# Patient Record
Sex: Male | Born: 1935 | Race: White | Hispanic: No | Marital: Married | Smoking: Former smoker
Health system: Southern US, Community
[De-identification: ages and names within clinical notes are randomized; demographics above are authoritative.]

## PROBLEM LIST (undated history)

## (undated) DIAGNOSIS — I729 Aneurysm of unspecified site: Secondary | ICD-10-CM

---

## 2008-11-27 ENCOUNTER — Ambulatory Visit: Payer: Self-pay | Admitting: Ophthalmology

## 2008-11-30 ENCOUNTER — Emergency Department: Payer: Self-pay | Admitting: Unknown Physician Specialty

## 2008-11-30 ENCOUNTER — Ambulatory Visit: Payer: Self-pay

## 2008-12-22 ENCOUNTER — Ambulatory Visit: Payer: Self-pay

## 2011-03-16 ENCOUNTER — Emergency Department: Payer: Self-pay | Admitting: Emergency Medicine

## 2012-04-04 ENCOUNTER — Emergency Department: Payer: Self-pay | Admitting: Emergency Medicine

## 2014-10-09 ENCOUNTER — Observation Stay: Payer: Self-pay | Admitting: Internal Medicine

## 2014-12-23 NOTE — Consult Note (Signed)
Chief Complaint:  Subjective/Chief Complaint no bm or bleeding since yesterday am.  tolerated enemas.  denies abdominal pain or nausea.   VITAL SIGNS/ANCILLARY NOTES: **Vital Signs.:   18-Feb-16 04:50  Vital Signs Type Routine  Temperature Temperature (F) 97.8  Celsius 36.5  Pulse Pulse 70  Respirations Respirations 18  Systolic BP Systolic BP 009  Diastolic BP (mmHg) Diastolic BP (mmHg) 72  Mean BP 102  Pulse Ox % Pulse Ox % 97  Pulse Ox Activity Level  At rest  Oxygen Delivery Room Air/ 21 %   Brief Assessment:  Cardiac Regular   Respiratory clear BS   Gastrointestinal details normal Soft  Nontender  Nondistended  No masses palpable  Bowel sounds normal   Lab Results: Routine Chem:  18-Feb-16 06:30   Glucose, Serum 79  BUN 18  Creatinine (comp) 1.02  Sodium, Serum 138  Potassium, Serum 3.6  Chloride, Serum 103  CO2, Serum 25  Calcium (Total), Serum 8.7  Anion Gap 10  Osmolality (calc) 276  eGFR (African American) >60  eGFR (Non-African American) >60 (eGFR values <97mL/min/1.73 m2 may be an indication of chronic kidney disease (CKD). Calculated eGFR, using the MRDR Study equation, is useful in  patients with stable renal function. The eGFR calculation will not be reliable in acutely ill patients when serum creatinine is changing rapidly. It is not useful in patients on dialysis. The eGFR calculation may not be applicable to patients at the low and high extremes of body sizes, pregnant women, and vegetarians.)  Routine Coag:  16-Feb-16 19:15   INR 1.1 (INR reference interval applies to patients on anticoagulant therapy. A single INR therapeutic range for coumarins is not optimal for all indications; however, the suggested range for most indications is 2.0 - 3.0. Exceptions to the INR Reference Range may include: Prosthetic heart valves, acute myocardial infarction, prevention of myocardial infarction, and combinations of aspirin and anticoagulant. The  need for a higher or lower target INR must be assessed individually. Reference: The Pharmacology and Management of the Vitamin K  antagonists: the seventh ACCP Conference on Antithrombotic and Thrombolytic Therapy. FGHWE.9937 Sept:126 (3suppl): [id number]. A HCT value >55% may artifactually increase the PT.  In one study,  the increase was an average of 25%. Reference:  "Effect on Routine and Special Coagulation Testing Values of Citrate Anticoagulant Adjustment in Patients with High HCT Values." American Journal of Clinical Pathology 2006;126:400-405.)  Routine Hem:  17-Feb-16 07:38   Hemoglobin (CBC) 13.0  18-Feb-16 06:30   WBC (CBC) 7.3  RBC (CBC)  3.96  Hemoglobin (CBC)  12.8  Hematocrit (CBC)  39.0  Platelet Count (CBC)  64  MCV 99  MCH 32.5  MCHC 33.0  RDW 13.5  Neutrophil % 69.9  Lymphocyte % 18.1  Monocyte % 8.8  Eosinophil % 2.2  Basophil % 1.0  Neutrophil # 5.1  Lymphocyte # 1.3  Eosinophil # 0.2  Basophil # 0.1 (Result(s) reported on 11 Oct 2014 at 07:36AM.)   Assessment/Plan:  Assessment/Plan:  Assessment 1) hematochezia-not recurrent since hospitalization, no abd pain, no rectal pain , no n/v.  2) h/o "inguinal aneurysm" per patietn.  I could not find charts in this regard.  Per patietn description today, he had a exploratory surgery many years ago in [place], was transferred urgently to [facility] where two further surgeries were done,  these sounding much like a possible aortic aneurysm repair.   Plan 1) flexible sigmoidoscopy today, I have discussed the risks benedits and complications of proceedure to include  not limited to  bleeding infection and perforation and he wishes to proceed.   Further recs to follow.   Electronic Signatures: Loistine Simas (MD)  (Signed 18-Feb-16 15:28)  Authored: Chief Complaint, VITAL SIGNS/ANCILLARY NOTES, Brief Assessment, Lab Results, Assessment/Plan   Last Updated: 18-Feb-16 15:28 by Loistine Simas (MD)

## 2014-12-23 NOTE — Consult Note (Signed)
PATIENT NAME:  Brent Carpenter, Brent Carpenter MR#:  [id number] DATE OF BIRTH:  [birth year]  DATE OF CONSULTATION:  10/10/2014  REFERRING PHYSICIAN:   CONSULTING PHYSICIAN:  Keturah Barrehristiane H. Trica Usery, NP  REASON FOR CONSULTATION: GI consult ordered by Dr. Clint GuyHower for evaluation of bright red blood per rectum.   HISTORY OF PRESENT ILLNESS: I appreciate consult for an 81 year old Caucasian man with history of benign essential tremors for evaluation of brpr. No history of colonoscopy. Reports passing a small amount of bright red blood with stool 2 days ago, worried this would happen again so the night before last, put extra towels on his bed before going to sleep to catch any blood. Looked 7 hours later with approximately a cupful of bright red blood on the towels. States he had a small amount of blood yesterday with even less today. Denies problems with constipation, straining, heavy lifting, abdominal and rectal pain, melena, and other episodes of rectal bleeding. States he currently feels great. Denies family history of colorectal cancer, colon polyps. His hemoglobin has been checked 3 times, last count 13, platelets are low, red cells normocytic. PT-INR normal. LFTs unremarkable. GFR 50.   REVIEW OF SYSTEMS: Ten systems reviewed. Significant for hearing loss bilaterally and benign tremors.   PAST MEDICAL HISTORY: Appendectomy, questionable inguinal aneurysm repair, cataract surgery, glaucoma, possible hypertension. Does not have PCP or follow with one in many years.   FAMILY HISTORY: Mother with CVA. Father with CHF. Sister with brain tumor. No known colorectal cancer, colon polyps, liver disease, or ulcers.   ALLERGIES: PENICILLIN.   HOME MEDICATIONS: Propranolol twice a day, unknown dose, acetylsalicylic acid 81 mg p.o. twice a day.   LABORATORY DATA: Most recent labs: Glucose 102. BUN 31, creatinine 1.41, sodium 143, potassium 4.1, GFR 50, calcium 9, total protein 7.3, albumin 3.7, total bilirubin 1.2, ALP 69, AST  30, ALT 32. WBC 7.4, hemoglobin has ranged 14.7 to 13.4 to 13, hematocrit 43 to 40.7 to 39, platelets 51. Red cells normocytic. PT 14, INR 1.1.   PHYSICAL EXAMINATION: VITAL SIGNS: Most recent: Temperature 97.1, pulse 60, respiratory rate 20, blood pressure 143/62, oxygen saturation 98% on room air.  GENERAL: Well-appearing elderly gentleman in no acute distress.  HEENT: Normocephalic, atraumatic. Conjunctivae: Pink. Sclerae: Clear. Mouth: No lesions. Mucous membranes: Pink and moist.  NECK: Supple. No thyromegaly or JVD.  CHEST: Respirations eupneic.  LUNGS: Clear bilaterally.  CARDIAC: S1, S2, RRR. No MRG. No appreciable edema.  ABDOMEN: Protuberant abdomen. Bowel sounds x 4. Soft, nontender, nondistended. No guarding, rigidity, peritoneal signs, or other abnormalities. No masses or hepatosplenomegaly.  SKIN: Warm, dry, pink. No erythema, lesion, or rash.  RECTAL: Small external hemorrhoid. Internal exam with small mobile irregularity, nontender, large prostate. There is some pink, mucousy effluent on my finger.  MUSCULOSKELETAL: MAEW x 4. Strength is 5/5.  NEUROLOGICAL: Cranial nerves II through XII intact. Alert, oriented x 3.  PSYCHIATRIC: Pleasant, calm, somewhat tangential, cooperative.   IMPRESSION AND PLAN: Bright red blood per rectum, suspect anal outlet bleeding. Would recommend Anusol suppositories b.i.d. for 10 days for now. I did discuss possibility of flexible sigmoidoscopy with Dr. Marva PandaSkulskie, as the patient has never had colonoscopy. The patient has eaten a clear liquid lunch today, so what we will do is we will reassess blood count tomorrow to check his thrombocytopenia. Plan for flexible sigmoidoscopy in the afternoon, as clinically feasible. We will also assess A PSA, considering CT abdomen and pelvis. Thank you very much for this consult.   These  services were provided by Vevelyn Pat, MSN, NPC, under collaboration with Christena Deem, MD, with whom I discussed this  patient in full    ____________________________ Keturah Barre, NP chl:mw D: 10/10/2014 15:11:41 ET T: 10/10/2014 15:34:33 ET JOB#: [id number]  cc: Keturah Barre, NP, <Dictator> Eustaquio Maize Zaiden Ludlum FNP ELECTRONICALLY SIGNED 10/11/2014 17:05

## 2014-12-23 NOTE — Consult Note (Signed)
Chief Complaint:  Subjective/Chief Complaint seen for hematochezia.  Please see full Gi consult and brief consult note.   Patietn admitted with small amounts of bright red rectal bleeding.  over the past several days.  Hemodynamically stable, hgb stable.  Likely anal outlet bleeding/hemorrhoids.  Continue current, will arrange for flex sig tomorrow pm, likely without sedation, depending on platelet count.   I have discussed the risks beneftis and complications of proceedure to include not limited to bleeding infection perforations and sedation and he wishes to proceed.   Further recs to follow.   VITAL SIGNS/ANCILLARY NOTES: **Vital Signs.:   17-Feb-16 04:25  Vital Signs Type Routine  Temperature Temperature (F) 97.1  Celsius 36.1  Temperature Source oral  Respirations Respirations 20  Systolic BP Systolic BP 291  Diastolic BP (mmHg) Diastolic BP (mmHg) 62  Mean BP 89  Pulse Ox % Pulse Ox % 98  Pulse Ox Activity Level  At rest  Oxygen Delivery Room Air/ 21 %   Brief Assessment:  Cardiac Regular   Respiratory clear BS   Gastrointestinal details normal Soft  Nontender  Nondistended  No masses palpable  Bowel sounds normal   Lab Results: Routine Chem:  17-Feb-16 01:09   Glucose, Serum  102  BUN  31  Creatinine (comp)  1.41  Sodium, Serum 143  Potassium, Serum 4.1  Chloride, Serum  110  CO2, Serum 24  Calcium (Total), Serum 9.0  Anion Gap 9  Osmolality (calc) 292  eGFR (African American) >60  eGFR (Non-African American)  50 (eGFR values <47m/min/1.73 m2 may be an indication of chronic kidney disease (CKD). Calculated eGFR, using the MRDR Study equation, is useful in  patients with stable renal function. The eGFR calculation will not be reliable in acutely ill patients when serum creatinine is changing rapidly. It is not useful in patients on dialysis. The eGFR calculation may not be applicable to patients at the low and high extremes of body sizes, pregnant women, and  vegetarians.)  Routine Coag:  16-Feb-16 19:15   INR 1.1 (INR reference interval applies to patients on anticoagulant therapy. A single INR therapeutic range for coumarins is not optimal for all indications; however, the suggested range for most indications is 2.0 - 3.0. Exceptions to the INR Reference Range may include: Prosthetic heart valves, acute myocardial infarction, prevention of myocardial infarction, and combinations of aspirin and anticoagulant. The need for a higher or lower target INR must be assessed individually. Reference: The Pharmacology and Management of the Vitamin K  antagonists: the seventh ACCP Conference on Antithrombotic and Thrombolytic Therapy. CBTYOM.6004Sept:126 (3suppl): [id number] A HCT value >55% may artifactually increase the PT.  In one study,  the increase was an average of 25%. Reference:  "Effect on Routine and Special Coagulation Testing Values of Citrate Anticoagulant Adjustment in Patients with High HCT Values." American Journal of Clinical Pathology 2006;126:400-405.)  Routine Hem:  16-Feb-16 19:15   Hemoglobin (CBC) 14.7  Platelet Count (CBC)  67 (Result(s) reported on 09 Oct 2014 at 07:48PM.)  17-Feb-16 01:09   WBC (CBC) 8.8  RBC (CBC)  4.10  Hemoglobin (CBC) 13.4  Hematocrit (CBC) 40.7  Platelet Count (CBC)  62  MCV 99  MCH 32.8  MCHC 33.0  RDW 14.0  Neutrophil % 70.6  Lymphocyte % 19.1  Monocyte % 8.9  Eosinophil % 0.8  Basophil % 0.6  Neutrophil # 6.2  Lymphocyte # 1.7  Monocyte # 0.8  Eosinophil # 0.1  Basophil # 0.1 (Result(s) reported on 10 Oct 2014 at 01:56AM.)    07:38   WBC (CBC) 7.4  RBC (CBC)  3.92  Hemoglobin (CBC) 13.0  Hematocrit (CBC)  39.0  Platelet Count (CBC)  51  MCV 100  MCH 33.2  MCHC 33.3  RDW 13.7  Neutrophil % 65.9  Lymphocyte % 21.3  Monocyte % 9.8  Eosinophil % 1.8  Basophil % 1.2  Neutrophil # 4.9  Lymphocyte # 1.6  Monocyte # 0.7  Eosinophil # 0.1  Basophil # 0.1 (Result(s) reported on  10 Oct 2014 at 08:04AM.)   Electronic Signatures: Loistine Simas (MD)  (Signed 17-Feb-16 15:03)  Authored: Chief Complaint, VITAL SIGNS/ANCILLARY NOTES, Brief Assessment, Lab Results   Last Updated: 17-Feb-16 15:03 by Loistine Simas (MD)

## 2014-12-23 NOTE — Discharge Summary (Signed)
PATIENT NAME:  Brent Carpenter, Brent Carpenter MR#:  [id number] DATE OF BIRTH:  Dec 11, 1923  DATE OF ADMISSION:  10/09/2014 DATE OF DISCHARGE:  10/11/2014  DISCHARGE DIAGNOSES: Internal hemorrhoid, acute renal failure, hypertension, fall and weakness.   PROCEDURES: Sigmoidoscopy showed internal hemorrhoid.   CONDITION: Stable.   CODE STATUS: Full code.   HOME MEDICATIONS: Please refer to the medication reconciliation list.   SERVICES: The patient needs home health, physical therapy, and nurse aide.   DIET: Low-sodium diet.   ACTIVITY: As tolerated.   FOLLOWUP CARE: Follow with PCP and Dr. Marva PandaSkulskie within 1-2 weeks.   REASON FOR ADMISSION: Bright red blood per rectum.    HOSPITAL COURSE: The patient is an 81 year old Caucasian gentleman with a history of hypertension came to the hospital due to bright red blood per rectum. For detailed history and physical examination, please refer to the admission note dictated by Dr. Clint GuyHower. After admission, the patient was treated with Protonix. Dr. Marva PandaSkulskie suggested a sigmoidoscopy, which showed internal hemorrhoids. In addition, the patient has acute renal failure, which is improved after treatment with IV fluids. According to physical therapy evaluation, the patient needs skilled nursing facility placement; however, the patient refused to go to rehabilitation facility. The patient was discharged to home with home health and physical therapy.   I discussed the patient'Carpenter discharge plan with nurse, case manager, and Dr. Marva PandaSkulskie.   TIME SPENT: About 35 minutes.   ____________________________ Shaune PollackQing Lemarcus Baggerly, MD qc:bm D: 10/16/2014 15:50:47 ET T: 10/17/2014 01:57:46 ET JOB#: [id number]  cc: Shaune PollackQing Rodgerick Gilliand, MD, <Dictator> Shaune PollackQING Hanifa Antonetti MD ELECTRONICALLY SIGNED 10/18/2014 12:48

## 2014-12-23 NOTE — H&P (Signed)
PATIENT NAME:  Brent Carpenter, Brent Carpenter MR#:  [id number] DATE OF BIRTH:  03/16/1924  DATE OF ADMISSION:  10/09/2014  REFERRING PHYSICIAN: Bobetta Lime A. Inocencio Homes, MD  PRIMARY CARE PHYSICIAN: None.   CHIEF COMPLAINT: Bright red blood per rectum.  HISTORY OF PRESENT ILLNESS:  An 81 year old Caucasian gentleman with history of essential hypertension, presented with bright red blood per rectum. He is a fairly poor historian. History obtained from the patient as well as wife, who is present at bedside and describes 1 episode of bright red blood per rectum, which occurred originally about 2 days ago, states,  "about a handful of blood", not mixed with stool. Had a repeat episode today of bright red blood.  Once again, no stool, thus, was presented to the hospital for further workup and evaluation. Denies any chest pain, palpitations, lightheadedness, abdominal pain, fever, chills, further symptomatology.   REVIEW OF SYSTEMS:  CONSTITUTIONAL: Denies fevers, chills, fatigue, weakness.  EYES: Denies blurred vision, double vision, eye pain.  EARS, NOSE, THROAT: Denies tinnitus, ear pain, hearing loss. RESPIRATORY: Denies cough, wheeze, or shortness of breath. CARDIOVASCULAR: Denies chest pain, palpitations, edema.  GASTROINTESTINAL: Denies nausea, vomiting, diarrhea, abdominal pain. Positive for bright blood per rectum.  GENITOURINARY: Denies dysuria or hematuria.  ENDOCRINE: Denies nocturia, thyroid problems.  HEMATOLOGIC AND LYMPHATIC: No easy bruising or bleeding.  SKIN: No mass, rashes or lesions.  MUSCULOSKELETAL: Denies pain in neck, back, shoulder, knees, hips or arthritic symptoms.  NEUROLOGIC: Denies paralysis, paresthesias.  PSYCHIATRIC: Denies anxiety or depressive symptoms.  Otherwise full review of systems performed by me is negative.   PAST MEDICAL HISTORY:  Includes essential hypertension; however, does not follow with PCP.  No further diagnoses.   SOCIAL HISTORY: Denies alcohol, tobacco, or drug  usage.   FAMILY HISTORY: Denies any known cardiovascular or pulmonary disorders.   ALLERGIES: PENICILLIN.   HOME MEDICATIONS: Include hydrochlorothiazide 25 mg p.o. daily, acetaminophen/oxycodone 325/5 mg p.o. every 4 hours as needed for pain, aspirin 81 mg p.o. daily.   PHYSICAL EXAMINATION:  VITAL SIGNS: Temperature 97.7, heart rate 62, respirations 20, blood pressure 158/76, saturating 96% on room air. Weight 90.7 kg, BMI 27.9.  GENERAL: Well-nourished, well-developed, Caucasian gentleman, currently in no acute distress.  HEAD: Normocephalic, atraumatic.  EYES: Pupils equal, round, and also intact. No scleral icterus.  MOUTH: Moist mucosal membrane. Dentition intact. No abscess noted.  EARS, NOSE AND THROAT: Clear without exudates. No external lesions.  NECK: Supple. No thyromegaly. No nodules. No JVD.  PULMONARY: Clear to auscultation bilaterally without wheezes, rubs, or rhonchi. No use of accessory muscles. Good respiratory effort.  CHEST: Nontender to palpation.  CARDIOVASCULAR: S1, S2, regular rate and rhythm. No murmurs, rubs, or gallops. No edema. Pedal pulses 2+ bilaterally.  GASTROINTESTINAL: Soft, nontender, nondistended. No masses. Positive bowel sounds. No hepatosplenomegaly.  MUSCULOSKELETAL: No swelling, clubbing, or edema. Range of motion full in all extremities.  NEUROLOGIC: Cranial nerves II through XII intact. No gross focal neurological deficits. Sensation intact, reflexes intact.  SKIN: No ulceration, lesions, rashes, or cyanosis. Skin warm and dry. Turgor intact.  LABORATORY DATA: Sodium of 141, potassium 4.5, chloride 108, bicarbonate 23, BUN 30, creatinine 1.59, glucose 115. LFTs: Bilirubin 1.2, otherwise, within normal limits. WBC 9.8, hemoglobin 14.7, platelets of 67,000.   ASSESSMENT AND PLAN: An 81 year old Caucasian male with a history of essential hypertension, presented with blood per rectum.   1. Bright red blood per rectum. CBCs  every 6 hours. We will  consult gastroenterology. Hold aspirin and further anticoagulants  and antiplatelet medications and transfuse hemoglobin of less than 7.  2. Acute kidney injury: Likely prerenal in etiology. Provide IV fluid hydration. Follow urine output and renal function. Hold hydrochlorothiazide.  3. Hypertension, essential. Hold hydrochlorothiazide as stated above.  4. Venous thromboembolism prophylaxis with sequential compression devices.  CODE STATUS: Full code.   TIME SPENT: 45 minutes.   ____________________________ Cletis Athensavid K. Candon Caras, MD dkh:ap D: 10/09/2014 23:07:49 ET T: 10/09/2014 23:20:38 ET JOB#: [id number]  cc: Cletis Athensavid K. Trynity Skousen, MD, <Dictator> Reise Gladney Synetta ShadowK Jorryn Casagrande MD ELECTRONICALLY SIGNED 10/10/2014 20:40

## 2014-12-23 NOTE — Consult Note (Signed)
Brief Consult Note: Diagnosis: BRPR.   Patient was seen by consultant.   Consult note dictated.   Comments: APpreciate consult for 81y/o caucasian man with hx benign essential tremors for evaluation of brpr. No history of colonoscopy. Reports passing a small amount of bright red blood with stool 2d ago. Worried that this would happen again, so night before last put extra towels on his bed before going to sleep to catch any blood. Woke 7h later with about a cup full of brpr on them. States he had a small amt blood with stool yesterday and even less today with bowel movement. Denies problems with constipation, straining, heavy lifting, abdominal and rectal pain, melena/other episodes of rectal bleeding. States he currently feels great.  Denies family history of colorectal cancer & colon polyps. His hemoglobin has been checked 3 times, last count 13. Platelets low. Red cells normocytic. PT/INR normal. LFts unremarkable. GFR 50. rectal exam nontender. Has small external hemorrhoid and small irregular soft internal abnormality: small amount of pink effluent was on finger. Abdominal exam benign. Hemodynamically stable. Impression/plan: brpr. Suspect anal outlet bleeding: would recommend anusol supp bid x 10d for now. Will discuss possibility of flexible sigmoidoscopy with Dr Marva PandaSkulskie, as patient has never had cspy: patient has eaten a clear liquid lunch today.  Electronic Signatures: Vevelyn PatLondon, Tomasz Steeves H (NP)  (Signed 17-Feb-16 13:33)  Authored: Brief Consult Note   Last Updated: 17-Feb-16 13:33 by Keturah BarreLondon, Nazaire Cordial H (NP)

## 2018-09-12 ENCOUNTER — Other Ambulatory Visit: Payer: Self-pay | Admitting: Nephrology

## 2018-09-12 DIAGNOSIS — N183 Chronic kidney disease, stage 3 unspecified: Secondary | ICD-10-CM

## 2018-10-10 ENCOUNTER — Ambulatory Visit
Admission: RE | Admit: 2018-10-10 | Discharge: 2018-10-10 | Disposition: A | Discharge status: Home / Self Care | Payer: Medicare Other | Source: Ambulatory Visit | Attending: Nephrology | Admitting: Nephrology

## 2018-10-10 DIAGNOSIS — N183 Chronic kidney disease, stage 3 unspecified: Secondary | ICD-10-CM

## 2019-05-17 ENCOUNTER — Inpatient Hospital Stay
Admission: EM | Admit: 2019-05-17 | Discharge: 2019-05-22 | DRG: 683 | Disposition: A | Discharge status: Skilled Nursing Facility | Payer: Medicare Other | Attending: Internal Medicine | Admitting: Internal Medicine

## 2019-05-17 ENCOUNTER — Encounter: Payer: Self-pay | Admitting: Emergency Medicine

## 2019-05-17 ENCOUNTER — Emergency Department: Payer: Medicare Other

## 2019-05-17 ENCOUNTER — Other Ambulatory Visit: Payer: Self-pay

## 2019-05-17 DIAGNOSIS — N179 Acute kidney failure, unspecified: Principal | ICD-10-CM

## 2019-05-17 DIAGNOSIS — Y92009 Unspecified place in unspecified non-institutional (private) residence as the place of occurrence of the external cause: Secondary | ICD-10-CM

## 2019-05-17 DIAGNOSIS — R7989 Other specified abnormal findings of blood chemistry: Secondary | ICD-10-CM

## 2019-05-17 DIAGNOSIS — Z20828 Contact with and (suspected) exposure to other viral communicable diseases: Secondary | ICD-10-CM | POA: Diagnosis present

## 2019-05-17 DIAGNOSIS — N138 Other obstructive and reflux uropathy: Secondary | ICD-10-CM | POA: Diagnosis present

## 2019-05-17 DIAGNOSIS — E860 Dehydration: Secondary | ICD-10-CM | POA: Diagnosis present

## 2019-05-17 DIAGNOSIS — R54 Age-related physical debility: Secondary | ICD-10-CM | POA: Diagnosis present

## 2019-05-17 DIAGNOSIS — S8012XA Contusion of left lower leg, initial encounter: Secondary | ICD-10-CM | POA: Diagnosis present

## 2019-05-17 DIAGNOSIS — R778 Other specified abnormalities of plasma proteins: Secondary | ICD-10-CM

## 2019-05-17 DIAGNOSIS — R451 Restlessness and agitation: Secondary | ICD-10-CM | POA: Diagnosis not present

## 2019-05-17 DIAGNOSIS — R310 Gross hematuria: Secondary | ICD-10-CM | POA: Diagnosis not present

## 2019-05-17 DIAGNOSIS — J90 Pleural effusion, not elsewhere classified: Secondary | ICD-10-CM

## 2019-05-17 DIAGNOSIS — M6282 Rhabdomyolysis: Secondary | ICD-10-CM | POA: Diagnosis present

## 2019-05-17 DIAGNOSIS — S40022A Contusion of left upper arm, initial encounter: Secondary | ICD-10-CM | POA: Diagnosis present

## 2019-05-17 DIAGNOSIS — Z7982 Long term (current) use of aspirin: Secondary | ICD-10-CM

## 2019-05-17 DIAGNOSIS — E870 Hyperosmolality and hypernatremia: Secondary | ICD-10-CM | POA: Diagnosis present

## 2019-05-17 DIAGNOSIS — Z9181 History of falling: Secondary | ICD-10-CM

## 2019-05-17 DIAGNOSIS — R251 Tremor, unspecified: Secondary | ICD-10-CM | POA: Diagnosis present

## 2019-05-17 DIAGNOSIS — D696 Thrombocytopenia, unspecified: Secondary | ICD-10-CM | POA: Diagnosis present

## 2019-05-17 DIAGNOSIS — D631 Anemia in chronic kidney disease: Secondary | ICD-10-CM | POA: Diagnosis present

## 2019-05-17 DIAGNOSIS — N132 Hydronephrosis with renal and ureteral calculous obstruction: Secondary | ICD-10-CM | POA: Diagnosis present

## 2019-05-17 DIAGNOSIS — H409 Unspecified glaucoma: Secondary | ICD-10-CM | POA: Diagnosis present

## 2019-05-17 DIAGNOSIS — N183 Chronic kidney disease, stage 3 (moderate): Secondary | ICD-10-CM | POA: Diagnosis present

## 2019-05-17 DIAGNOSIS — E872 Acidosis: Secondary | ICD-10-CM | POA: Diagnosis present

## 2019-05-17 DIAGNOSIS — H9190 Unspecified hearing loss, unspecified ear: Secondary | ICD-10-CM | POA: Diagnosis present

## 2019-05-17 DIAGNOSIS — S40021A Contusion of right upper arm, initial encounter: Secondary | ICD-10-CM | POA: Diagnosis present

## 2019-05-17 DIAGNOSIS — R748 Abnormal levels of other serum enzymes: Secondary | ICD-10-CM

## 2019-05-17 DIAGNOSIS — N201 Calculus of ureter: Secondary | ICD-10-CM

## 2019-05-17 DIAGNOSIS — I129 Hypertensive chronic kidney disease with stage 1 through stage 4 chronic kidney disease, or unspecified chronic kidney disease: Secondary | ICD-10-CM | POA: Diagnosis present

## 2019-05-17 DIAGNOSIS — D72829 Elevated white blood cell count, unspecified: Secondary | ICD-10-CM | POA: Diagnosis present

## 2019-05-17 DIAGNOSIS — Z87891 Personal history of nicotine dependence: Secondary | ICD-10-CM

## 2019-05-17 DIAGNOSIS — Z823 Family history of stroke: Secondary | ICD-10-CM

## 2019-05-17 DIAGNOSIS — Z8679 Personal history of other diseases of the circulatory system: Secondary | ICD-10-CM

## 2019-05-17 DIAGNOSIS — N401 Enlarged prostate with lower urinary tract symptoms: Secondary | ICD-10-CM | POA: Diagnosis present

## 2019-05-17 DIAGNOSIS — W19XXXA Unspecified fall, initial encounter: Secondary | ICD-10-CM

## 2019-05-17 DIAGNOSIS — W010XXA Fall on same level from slipping, tripping and stumbling without subsequent striking against object, initial encounter: Secondary | ICD-10-CM | POA: Diagnosis present

## 2019-05-17 DIAGNOSIS — Z79899 Other long term (current) drug therapy: Secondary | ICD-10-CM

## 2019-05-17 DIAGNOSIS — S8011XA Contusion of right lower leg, initial encounter: Secondary | ICD-10-CM | POA: Diagnosis present

## 2019-05-17 DIAGNOSIS — R740 Nonspecific elevation of levels of transaminase and lactic acid dehydrogenase [LDH]: Secondary | ICD-10-CM | POA: Diagnosis present

## 2019-05-17 HISTORY — DX: Aneurysm of unspecified site: I72.9

## 2019-05-17 LAB — URINALYSIS, COMPLETE (UACMP) WITH MICROSCOPIC
Bacteria, UA: NONE SEEN
Bilirubin Urine: NEGATIVE
Glucose, UA: NEGATIVE mg/dL
Ketones, ur: NEGATIVE mg/dL
Leukocytes,Ua: NEGATIVE
Nitrite: NEGATIVE
Protein, ur: NEGATIVE mg/dL
Specific Gravity, Urine: 1.011 (ref 1.005–1.030)
pH: 5 (ref 5.0–8.0)

## 2019-05-17 LAB — COMPREHENSIVE METABOLIC PANEL
ALT: 28 U/L (ref 0–44)
AST: 83 U/L — ABNORMAL HIGH (ref 15–41)
Albumin: 3.6 g/dL (ref 3.5–5.0)
Alkaline Phosphatase: 103 U/L (ref 38–126)
Anion gap: 17 — ABNORMAL HIGH (ref 5–15)
BUN: 83 mg/dL — ABNORMAL HIGH (ref 8–23)
CO2: 18 mmol/L — ABNORMAL LOW (ref 22–32)
Calcium: 9.5 mg/dL (ref 8.9–10.3)
Chloride: 112 mmol/L — ABNORMAL HIGH (ref 98–111)
Creatinine, Ser: 3.48 mg/dL — ABNORMAL HIGH (ref 0.61–1.24)
GFR calc Af Amer: 17 mL/min — ABNORMAL LOW (ref >60)
GFR calc non Af Amer: 14 mL/min — ABNORMAL LOW (ref >60)
Glucose, Bld: 75 mg/dL (ref 70–99)
Potassium: 4.7 mmol/L (ref 3.5–5.1)
Sodium: 147 mmol/L — ABNORMAL HIGH (ref 135–145)
Total Bilirubin: 2.9 mg/dL — ABNORMAL HIGH (ref 0.3–1.2)
Total Protein: 7 g/dL (ref 6.5–8.1)

## 2019-05-17 LAB — CBC WITH DIFFERENTIAL/PLATELET
Abs Immature Granulocytes: 0.1 10*3/uL — ABNORMAL HIGH (ref 0.00–0.07)
Basophils Absolute: 0 10*3/uL (ref 0.0–0.1)
Basophils Relative: 0 %
Eosinophils Absolute: 0 10*3/uL (ref 0.0–0.5)
Eosinophils Relative: 0 %
HCT: 30.5 % — ABNORMAL LOW (ref 39.0–52.0)
Hemoglobin: 9.7 g/dL — ABNORMAL LOW (ref 13.0–17.0)
Immature Granulocytes: 1 %
Lymphocytes Relative: 5 %
Lymphs Abs: 0.8 10*3/uL (ref 0.7–4.0)
MCH: 28.9 pg (ref 26.0–34.0)
MCHC: 31.8 g/dL (ref 30.0–36.0)
MCV: 90.8 fL (ref 80.0–100.0)
Monocytes Absolute: 1.4 10*3/uL — ABNORMAL HIGH (ref 0.1–1.0)
Monocytes Relative: 9 %
Neutro Abs: 13.4 10*3/uL — ABNORMAL HIGH (ref 1.7–7.7)
Neutrophils Relative %: 85 %
Platelets: 98 10*3/uL — ABNORMAL LOW (ref 150–400)
RBC: 3.36 MIL/uL — ABNORMAL LOW (ref 4.22–5.81)
RDW: 15.9 % — ABNORMAL HIGH (ref 11.5–15.5)
WBC: 15.7 10*3/uL — ABNORMAL HIGH (ref 4.0–10.5)
nRBC: 0.2 % (ref 0.0–0.2)

## 2019-05-17 LAB — TROPONIN I (HIGH SENSITIVITY)
Troponin I (High Sensitivity): 131 ng/L (ref <18)
Troponin I (High Sensitivity): 111 ng/L (ref <18)

## 2019-05-17 LAB — SARS CORONAVIRUS 2 BY RT PCR (HOSPITAL ORDER, PERFORMED IN ~~LOC~~ HOSPITAL LAB): SARS Coronavirus 2: NEGATIVE

## 2019-05-17 LAB — CK: Total CK: 2305 U/L — ABNORMAL HIGH (ref 49–397)

## 2019-05-17 MED ORDER — SODIUM CHLORIDE 0.9 % IV SOLN
INTRAVENOUS | Status: DC
  Administered 2019-05-17 – 2019-05-21 (×6): via INTRAVENOUS

## 2019-05-17 MED ORDER — TRAMADOL HCL 50 MG PO TABS
50.0000 mg | ORAL_TABLET | Freq: Two times a day (BID) | ORAL | Status: DC | PRN
  Administered 2019-05-20: 50 mg via ORAL
  Filled 2019-05-17: qty 1

## 2019-05-17 MED ORDER — ASPIRIN 81 MG PO CHEW
324.0000 mg | CHEWABLE_TABLET | Freq: Once | ORAL | Status: AC
  Administered 2019-05-17: 324 mg via ORAL
  Filled 2019-05-17: qty 4

## 2019-05-17 MED ORDER — BRIMONIDINE TARTRATE 0.15 % OP SOLN
1.0000 [drp] | Freq: Two times a day (BID) | OPHTHALMIC | Status: DC
  Administered 2019-05-17 – 2019-05-22 (×8): 1 [drp] via OPHTHALMIC
  Filled 2019-05-17 (×2): qty 5

## 2019-05-17 MED ORDER — ONDANSETRON HCL 4 MG PO TABS: 4.0000 mg | ORAL_TABLET | Freq: Four times a day (QID) | ORAL | Status: DC | PRN

## 2019-05-17 MED ORDER — LABETALOL HCL 5 MG/ML IV SOLN: 10.0000 mg | INTRAVENOUS | Status: DC | PRN

## 2019-05-17 MED ORDER — SODIUM CHLORIDE 0.9 % IV BOLUS
1000.0000 mL | Freq: Once | INTRAVENOUS | Status: AC
  Administered 2019-05-17: 1000 mL via INTRAVENOUS

## 2019-05-17 MED ORDER — POLYETHYLENE GLYCOL 3350 17 G PO PACK: 17.0000 g | PACK | Freq: Every day | ORAL | Status: DC | PRN

## 2019-05-17 MED ORDER — ASPIRIN EC 81 MG PO TBEC
81.0000 mg | DELAYED_RELEASE_TABLET | Freq: Every day | ORAL | Status: DC
  Administered 2019-05-18 – 2019-05-20 (×3): 81 mg via ORAL
  Filled 2019-05-17 (×3): qty 1

## 2019-05-17 MED ORDER — ONDANSETRON HCL 4 MG/2ML IJ SOLN: 4.0000 mg | Freq: Four times a day (QID) | INTRAMUSCULAR | Status: DC | PRN

## 2019-05-17 MED ORDER — HEPARIN SODIUM (PORCINE) 5000 UNIT/ML IJ SOLN
5000.0000 [IU] | Freq: Three times a day (TID) | INTRAMUSCULAR | Status: DC
  Administered 2019-05-17 – 2019-05-20 (×8): 5000 [IU] via SUBCUTANEOUS
  Filled 2019-05-17 (×8): qty 1

## 2019-05-17 MED ORDER — AMLODIPINE BESYLATE 5 MG PO TABS
5.0000 mg | ORAL_TABLET | Freq: Every day | ORAL | Status: DC
  Administered 2019-05-17 – 2019-05-22 (×6): 5 mg via ORAL
  Filled 2019-05-17 (×6): qty 1

## 2019-05-17 MED ORDER — ACETAMINOPHEN 650 MG RE SUPP: 650.0000 mg | Freq: Four times a day (QID) | RECTAL | Status: DC | PRN

## 2019-05-17 MED ORDER — PROPRANOLOL HCL 40 MG PO TABS
60.0000 mg | ORAL_TABLET | Freq: Every day | ORAL | Status: DC
  Administered 2019-05-18 – 2019-05-22 (×5): 60 mg via ORAL
  Filled 2019-05-17 (×6): qty 1

## 2019-05-17 MED ORDER — ACETAMINOPHEN 325 MG PO TABS: 650.0000 mg | ORAL_TABLET | Freq: Four times a day (QID) | ORAL | Status: DC | PRN

## 2019-05-17 NOTE — ED Notes (Signed)
Patient transported to CT 

## 2019-05-17 NOTE — Progress Notes (Signed)
05/17/2019 7:17 PM  New Admission Note:   Arrival Method: from ED Mental Orientation: Alert to self Telemetry: no Assessment: pending Skin: extensive bruising arms and legs IV:  Pain: 0 Tubes: none Safety Measures: Safety Fall Prevention Plan has been given, discussed.  High fall risk patient utilize bed and chair alarms Admission: Completed Unit Orientation: Patient has been orientated to the room, unit and staff.  Family: Sons have been contacted  Orders have been reviewed and implemented. Will continue to monitor the patient. Call light has been placed within reach and bed alarm has been activated.   Dola Argyle

## 2019-05-17 NOTE — ED Triage Notes (Addendum)
Pt arrival from home via EMS due to fall. EMS states that patient fell yesterday and was found today on the ground. Pt is a&o x4. Home health checks in on pt while at home.  Pt denies pain at this moment. Pt very hard of hearing.   Hx of diptheria.

## 2019-05-17 NOTE — ED Notes (Signed)
Pt put into hospital gown and bathed with wipes at this time. Pt belongings in pt belonging bag.

## 2019-05-17 NOTE — ED Notes (Signed)
X-ray at bedside

## 2019-05-17 NOTE — ED Provider Notes (Signed)
[facility] Emergency Department Provider Note   ____________________________________________   I have reviewed the triage vital signs and the nursing notes.   HISTORY  Chief Complaint Fall   History limited by: Poor historian   HPI Brent Lave. is a 85 y.o. male who presents to the emergency department today after a fall.  Apparently patient was found down on the ground today by home health that checks on him.  There is concerned that the patient fell yesterday.  Patient states that he think he fell because of the thick carpet.  He denies any significant pain states that he is somewhat sore.  He denies any recent fevers or illness.  Denies any chest pain.  Records reviewed. Per medical record review patient has a history of aneurysm.  History reviewed. No pertinent past medical history.  There are no active problems to display for this patient.   Prior to Admission medications   Not on File    Allergies Patient has no known allergies.  History reviewed. No pertinent family history.  Social History Social History   Tobacco Use  . Smoking status: Not on file  Substance Use Topics  . Alcohol use: Not on file  . Drug use: Not on file    Review of Systems ROS limited secondary to poor historian and difficulty with hearing. Constitutional: No fever/chills Cardiovascular: Denies chest pain. Respiratory: Denies shortness of breath. Gastrointestinal: No abdominal pain.  No nausea, no vomiting.  No diarrhea.   Neurological: Negative for headaches, focal weakness or numbness.  ____________________________________________   PHYSICAL EXAM:  VITAL SIGNS: ED Triage Vitals  Enc Vitals Group     BP 05/17/19 1353 (!) 168/86     Pulse Rate 05/17/19 1353 85     Resp 05/17/19 1353 18     Temp 05/17/19 1353 97.7 F (36.5 C)     Temp Source 05/17/19 1353 Oral     SpO2 05/17/19 1348 99 %     Weight 05/17/19 1359 160 lb (72.6 kg)     Height  05/17/19 1359 5\' 11"  (1.803 m)     Head Circumference --      Peak Flow --      Pain Score 05/17/19 1359 0   Constitutional: Awake and alert. Not completely oriented to events.  Eyes: Conjunctivae are normal.  ENT      Head: Normocephalic and atraumatic.      Nose: No congestion/rhinnorhea.      Mouth/Throat: Mucous membranes are moist.      Neck: No stridor. Hematological/Lymphatic/Immunilogical: No cervical lymphadenopathy. Cardiovascular: Normal rate, irregularly irregular rhythm.  No murmurs, rubs, or gallops.  Respiratory: Normal respiratory effort without tachypnea nor retractions. Breath sounds are clear and equal bilaterally. No wheezes/rales/rhonchi. Gastrointestinal: Soft and non tender. No rebound. No guarding.  Genitourinary: Deferred Musculoskeletal: Normal range of motion in all extremities. No lower extremity edema. Neurologic:  Not oriented to events. Moving all extremities. Sensation grossly intact.  Skin:  Skin is warm, dry and intact. Bruising noted to extremities.  Psychiatric: Mood and affect are normal. Speech and behavior are normal. Patient exhibits appropriate insight and judgment.  ____________________________________________    LABS (pertinent positives/negatives)  Trop 131 CK 2305 CMP na 147, cl 112, cr 3.48, t bili 2.9 CBC wbc 15.7, hgb 9.7, plt 98  ____________________________________________   EKG  I, 05/19/19, attending physician, personally viewed and interpreted this EKG  EKG Time: 1505 Rate: 82 Rhythm: atrial fibrillation Axis: left axis deviation  Intervals: qtc 519 QRS: LVH, LAFB ST changes: no st elevation Impression: abnormal ekg  ____________________________________________    RADIOLOGY  CT head/cervical spine No acute traumatic findings. Calcified aneurysm, perhaps slightly enlarged over previous imaging  ____________________________________________   PROCEDURES  Procedures  CRITICAL CARE Performed by:  Nance Pear   Total critical care time: 35 minutes  Critical care time was exclusive of separately billable procedures and treating other patients.  Critical care was necessary to treat or prevent imminent or life-threatening deterioration.  Critical care was time spent personally by me on the following activities: development of treatment plan with patient and/or surrogate as well as nursing, discussions with consultants, evaluation of patient's response to treatment, examination of patient, obtaining history from patient or surrogate, ordering and performing treatments and interventions, ordering and review of laboratory studies, ordering and review of radiographic studies, pulse oximetry and re-evaluation of patient's condition.  ____________________________________________   INITIAL IMPRESSION / ASSESSMENT AND PLAN / ED COURSE  Pertinent labs & imaging results that were available during my care of the patient were reviewed by me and considered in my medical decision making (see chart for details).   Patient presented to the emergency department today after being found down in his house.  Patient cannot give a good history as to what happened.  Broad work-up was initiated.  Blood work is concerning for elevated troponin, CK and creatinine.  Unclear if patient had a primary cardiac event that caused the fall or if troponin is starting to elevate secondary to kidney dysfunction. Head ct and cervical spine ct without concerning traumatic injury.  Patient was started on IV fluids.  Patient will be admitted to the hospital service.  Awaiting urine at time of admission.   ____________________________________________   FINAL CLINICAL IMPRESSION(S) / ED DIAGNOSES  Final diagnoses:  Fall, initial encounter  Elevated troponin  Elevated CK  AKI (acute kidney injury) ([place])     Note: This dictation was prepared with Dragon dictation. Any transcriptional errors that result from this process  are unintentional     Nance Pear, MD 05/18/19 [phone]

## 2019-05-17 NOTE — ED Notes (Signed)
Son Shanon Brow: [phone] suzy pt daughter in law- [phone]

## 2019-05-17 NOTE — ED Notes (Signed)
Attempted to contact pt contact Brent Carpenter via phone. No answer and unable to leave voicemail at this time

## 2019-05-17 NOTE — ED Notes (Signed)
Attempted to call report, unable to give report at this time  

## 2019-05-17 NOTE — H&P (Addendum)
[facility] [place] [facility]   PATIENT NAME: Brent Carpenter    MR#:  [id number]  DATE OF BIRTH:  March 17, 1924  DATE OF ADMISSION:  05/17/2019  PRIMARY CARE PHYSICIAN: Jerl Mina, MD   REQUESTING/REFERRING PHYSICIAN: Phineas Semen, MD  CHIEF COMPLAINT:   Chief Complaint  Patient presents with   Fall    HISTORY OF PRESENT ILLNESS:  Brent Carpenter  is a 85 y.o. male with no significant past medical history who presented to the ED after being found down by a home health provider. Patient is a little confused, so history is obtained from the chart and the ED provider. Patient apparently thinks he may have tripped on thick carpet. He denies any chest pain, shortness of breath, abdominal pain, hip pain, knee pain.  In the ED, he was hypertensive.  Vitals were otherwise unremarkable.  Labs are significant for having, creatinine 3.48, CK 2,305, troponin 131, WBC 15.7, hemoglobin 9.7.  CT head and C-spine were negative for acute abnormalities.  He was given a 1 L normal saline bolus.  Hospitalists were called for admission.  PAST MEDICAL HISTORY:  None  PAST SURGICAL HISTORY:  Inguinal aneurysm surgery  SOCIAL HISTORY:   Social History   Tobacco Use   Smoking status: Not on file  Substance Use Topics   Alcohol use: Not on file    FAMILY HISTORY:  Mother- stroke  DRUG ALLERGIES:   Allergies  Allergen Reactions   Other Other (See Comments)    Newspaper - makes him sneeze    REVIEW OF SYSTEMS:   ROS- unable to obtain due to some confusion  MEDICATIONS AT HOME:   Prior to Admission medications   Medication Sig Start Date End Date Taking? Authorizing Provider  ALPHAGAN P 0.1 % SOLN Place 1 drop into both eyes 2 (two) times daily. 04/20/19  Yes [provider]  aspirin EC 81 MG tablet Take 81 mg by mouth daily.   Yes [provider]  propranolol (INDERAL) 60 MG tablet Take 60 mg by mouth daily. 05/10/19  Yes [provider]      VITAL SIGNS:  Blood pressure (!) 172/116, pulse 81, temperature 97.7 F (36.5 C), temperature source Oral, resp. rate (!) 21, height 5\' 11"  (1.803 m), weight 72.6 kg, SpO2 100 %.  PHYSICAL EXAMINATION:  Physical Exam  GENERAL:  85 y.o.-year-old patient lying in the bed with no acute distress.  EYES: Pupils equal, round, reactive to light and accommodation. No scleral icterus. Extraocular muscles intact.  HEENT: Head atraumatic, normocephalic. Oropharynx and nasopharynx clear.  NECK:  Supple, no jugular venous distention. No thyroid enlargement, no tenderness.  LUNGS: Normal breath sounds bilaterally, no wheezing, rales,rhonchi or crepitation. No use of accessory muscles of respiration.  CARDIOVASCULAR: RRR, S1, S2 normal. No murmurs, rubs, or gallops.  ABDOMEN: Soft, nontender, nondistended. Bowel sounds present. No organomegaly or mass.  EXTREMITIES: No pedal edema, cyanosis, or clubbing. No tenderness to palpation of the hips or legs. NEUROLOGIC: Cranial nerves II through XII are intact. +global weakness. Sensation intact. Gait not checked.  PSYCHIATRIC: The patient is alert and oriented x 1.  SKIN: No obvious rash, lesion, or ulcer. + Multiple ecchymoses present throughout the hands and arms.  LABORATORY PANEL:   CBC Recent Labs  Lab 05/17/19 1406  WBC 15.7*  HGB 9.7*  HCT 30.5*  PLT 98*   ------------------------------------------------------------------------------------------------------------------  Chemistries  Recent Labs  Lab 05/17/19 1406  NA 147*  K 4.7  CL  112*  CO2 18*  GLUCOSE 75  BUN 83*  CREATININE 3.48*  CALCIUM 9.5  AST 83*  ALT 28  ALKPHOS 103  BILITOT 2.9*   ------------------------------------------------------------------------------------------------------------------  Cardiac Enzymes No results for input(s): TROPONINI in the last 168  hours. ------------------------------------------------------------------------------------------------------------------  RADIOLOGY:  Ct Head Wo Contrast  Result Date: 05/17/2019 CLINICAL DATA:  Fall EXAM: CT HEAD WITHOUT CONTRAST CT CERVICAL SPINE WITHOUT CONTRAST TECHNIQUE: Multidetector CT imaging of the head and cervical spine was performed following the standard protocol without intravenous contrast. Multiplanar CT image reconstructions of the cervical spine were also generated. COMPARISON:  MR brain, 11/27/2008 FINDINGS: CT HEAD FINDINGS Brain: No evidence of acute infarction, hemorrhage, hydrocephalus, extra-axial collection or mass lesion/mass effect. Periventricular and deep B[place]rman matter hypodensity, global volume loss, and enlargement of the lateral ventricles ex vacuo. Nonacute encephalomalacia of the left parietal lobe (series 2, image 24). Vascular: No hyperdense vessel or unexpected calcification. 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 Skull: Normal. Negative for fracture or focal lesion. Sinuses/Orbits: No acute finding. Other: None. CT CERVICAL SPINE FINDINGS Alignment: Degenerative straightening of the normal cervical lordosis. Skull base and vertebrae: No acute fracture. No primary bone lesion or focal pathologic process. Soft tissues and spinal canal: No prevertebral fluid or swelling. No visible canal hematoma. Disc levels: Severe disc degenerative disease and osteophytosis of the lower cervical spine. Upper chest: Partially imaged right pleural effusion. Other: None. IMPRESSION: 1.  No acute intracranial pathology. 2. Small-vessel Squillace matter disease, global volume loss, enlargement of the lateral ventricles ex vacuo, progressed in comparison to examination dated  11/27/2008. 3.  Nonacute encephalomalacia of the left parietal lobe. 4. 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 5.  No fracture or static subluxation of the cervical spine. 6. Severe disc degenerative disease and osteophytosis of the lower cervical spine. 7. Partially imaged right pleural effusion. Electronically Signed   By: Eddie Candle M.D.   On: 05/17/2019 15:19   Ct Cervical Spine Wo Contrast  Result Date: 05/17/2019 CLINICAL DATA:  Fall EXAM: CT HEAD WITHOUT CONTRAST CT CERVICAL SPINE WITHOUT CONTRAST TECHNIQUE: Multidetector CT imaging of the head and cervical spine was performed following the standard protocol without intravenous contrast. Multiplanar CT image reconstructions of the cervical spine were also generated. COMPARISON:  MR brain, 11/27/2008 FINDINGS: CT HEAD FINDINGS Brain: No evidence of acute infarction, hemorrhage, hydrocephalus, extra-axial collection or mass lesion/mass effect. Periventricular and deep Aston matter hypodensity, global volume loss, and enlargement of the lateral ventricles ex vacuo. Nonacute encephalomalacia of the left parietal lobe (series 2, image 24). Vascular: No hyperdense vessel or unexpected calcification. 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 Skull: Normal. Negative for fracture or focal lesion. Sinuses/Orbits: No acute finding. Other: None. CT CERVICAL SPINE FINDINGS Alignment: Degenerative straightening of the normal cervical lordosis. Skull base and vertebrae:  No acute fracture. No primary bone lesion or focal pathologic  process. Soft tissues and spinal canal: No prevertebral fluid or swelling. No visible canal hematoma. Disc levels: Severe disc degenerative disease and osteophytosis of the lower cervical spine. Upper chest: Partially imaged right pleural effusion. Other: None. IMPRESSION: 1.  No acute intracranial pathology. 2. Small-vessel Bar matter disease, global volume loss, enlargement of the lateral ventricles ex vacuo, progressed in comparison to examination dated 11/27/2008. 3.  Nonacute encephalomalacia of the left parietal lobe. 4. 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 5.  No fracture or static subluxation of the cervical spine. 6. Severe disc degenerative disease and osteophytosis of the lower cervical spine. 7. Partially imaged right pleural effusion. Electronically Signed   By: Lauralyn Primes M.D.   On: 05/17/2019 15:19      IMPRESSION AND PLAN:   Acute renal failure in CKD III- likely due to rhabdomyolysis and dehydration. Creatinine 3.48. Baseline creatinine 1.6-1.9. -Continue IVFs -Avoid nephrotoxic agents -Check renal ultrasound  Mild rhabdomyolysis- after being found down on the ground. CK 2,300. -IVFs -Recheck CK in the morning  History of falls- patient has multiple bruises on his hands and arms.  States this fall occurred due to tripping on the carpet. -CT head and C-spine were unremarkable for acute abnormalities -PT consult  Elevated troponin- likely due to demand ischemia. No active chest pain.  Troponin 131 > 111. -Continue home aspirin 81 mg daily -Monitor  Hypernatremia- due to hydration -IVFs -Recheck Na in the morning  Leukocytosis- may be reactive.  No signs of infection. -Check UA and chest x-ray  Elevated AST- likely due to dehydration. No history  of alcohol use. -IVFs -Recheck in the morning  Normocytic anemia- no signs of bleeding. -Check anemia panel -Monitor  Hypertension- BP elevated in the ED -Start norvasc 5mg  daily -IV labetalol prn  Tremor- chronic issue for him -Continue home propranolol 60 mg daily  All the records are reviewed and case discussed with ED provider. Management plans discussed with the patient, family and they are in agreement.  CODE STATUS: Full- unable to get in touch with family- will need to address in the morning  TOTAL TIME TAKING CARE OF THIS PATIENT: 45 minutes.    Deshanae Lindo M.D on 05/17/2019 at 4:25 PM  Between 7am to 6pm - Pager [phone]  After 6pm go to [email or web address] - - [phone]  [facility]  Office  [phone]  CC: Primary care physician; [phone], MD   Note: This dictation was prepared with Dragon dictation along with smaller phrase technology. Any transcriptional errors that result from this process are unintentional.

## 2019-05-17 NOTE — ED Notes (Signed)
Date and time results received: 05/17/19 1453 (use smartphrase ".now" to insert current time)  Test: Troponin Critical Value: 131  Name of Provider Notified: Archie Balboa  Orders Received? Or Actions Taken?:

## 2019-05-18 ENCOUNTER — Encounter: Payer: Self-pay | Admitting: Anesthesiology

## 2019-05-18 ENCOUNTER — Inpatient Hospital Stay: Payer: Medicare Other

## 2019-05-18 ENCOUNTER — Encounter
Admission: EM | Disposition: A | Discharge status: Skilled Nursing Facility | Payer: Self-pay | Source: Home / Self Care | Attending: Specialist

## 2019-05-18 DIAGNOSIS — N179 Acute kidney failure, unspecified: Principal | ICD-10-CM

## 2019-05-18 LAB — COMPREHENSIVE METABOLIC PANEL
ALT: 30 U/L (ref 0–44)
AST: 69 U/L — ABNORMAL HIGH (ref 15–41)
Albumin: 3.3 g/dL — ABNORMAL LOW (ref 3.5–5.0)
Alkaline Phosphatase: 96 U/L (ref 38–126)
Anion gap: 15 (ref 5–15)
BUN: 93 mg/dL — ABNORMAL HIGH (ref 8–23)
CO2: 19 mmol/L — ABNORMAL LOW (ref 22–32)
Calcium: 8.9 mg/dL (ref 8.9–10.3)
Chloride: 114 mmol/L — ABNORMAL HIGH (ref 98–111)
Creatinine, Ser: 3.54 mg/dL — ABNORMAL HIGH (ref 0.61–1.24)
GFR calc Af Amer: 16 mL/min — ABNORMAL LOW (ref >60)
GFR calc non Af Amer: 14 mL/min — ABNORMAL LOW (ref >60)
Glucose, Bld: 94 mg/dL (ref 70–99)
Potassium: 4.3 mmol/L (ref 3.5–5.1)
Sodium: 148 mmol/L — ABNORMAL HIGH (ref 135–145)
Total Bilirubin: 1.9 mg/dL — ABNORMAL HIGH (ref 0.3–1.2)
Total Protein: 6.1 g/dL — ABNORMAL LOW (ref 6.5–8.1)

## 2019-05-18 LAB — CBC
HCT: 27.9 % — ABNORMAL LOW (ref 39.0–52.0)
Hemoglobin: 8.9 g/dL — ABNORMAL LOW (ref 13.0–17.0)
MCH: 29 pg (ref 26.0–34.0)
MCHC: 31.9 g/dL (ref 30.0–36.0)
MCV: 90.9 fL (ref 80.0–100.0)
Platelets: 102 10*3/uL — ABNORMAL LOW (ref 150–400)
RBC: 3.07 MIL/uL — ABNORMAL LOW (ref 4.22–5.81)
RDW: 15.9 % — ABNORMAL HIGH (ref 11.5–15.5)
WBC: 15.5 10*3/uL — ABNORMAL HIGH (ref 4.0–10.5)
nRBC: 0.1 % (ref 0.0–0.2)

## 2019-05-18 LAB — IRON AND TIBC
Iron: 33 ug/dL — ABNORMAL LOW (ref 45–182)
Saturation Ratios: 11 % — ABNORMAL LOW (ref 17.9–39.5)
TIBC: 298 ug/dL (ref 250–450)
UIBC: 265 ug/dL

## 2019-05-18 LAB — FERRITIN: Ferritin: 94 ng/mL (ref 24–336)

## 2019-05-18 LAB — FOLATE: Folate: 10.1 ng/mL (ref >5.9)

## 2019-05-18 LAB — VITAMIN B12: Vitamin B-12: 813 pg/mL (ref 180–914)

## 2019-05-18 LAB — CK: Total CK: 1012 U/L — ABNORMAL HIGH (ref 49–397)

## 2019-05-18 SURGERY — CYSTOSCOPY/URETEROSCOPY/HOLMIUM LASER/STENT PLACEMENT
Anesthesia: General | Laterality: Left

## 2019-05-18 MED ORDER — HALOPERIDOL LACTATE 5 MG/ML IJ SOLN
2.0000 mg | Freq: Four times a day (QID) | INTRAMUSCULAR | Status: DC | PRN
  Administered 2019-05-18 – 2019-05-20 (×2): 2 mg via INTRAVENOUS
  Filled 2019-05-18 (×2): qty 1

## 2019-05-18 MED ORDER — TAMSULOSIN HCL 0.4 MG PO CAPS
0.4000 mg | ORAL_CAPSULE | Freq: Every day | ORAL | Status: DC
  Administered 2019-05-19 – 2019-05-22 (×4): 0.4 mg via ORAL
  Filled 2019-05-18 (×4): qty 1

## 2019-05-18 SURGICAL SUPPLY — 29 items
BAG DRAIN CYSTO-URO LG1000N (MISCELLANEOUS) ×3 IMPLANT
BRUSH SCRUB EZ 1% IODOPHOR (MISCELLANEOUS) ×3 IMPLANT
CATH URETL 5X70 OPEN END (CATHETERS) IMPLANT
CNTNR SPEC 2.5X3XGRAD LEK (MISCELLANEOUS)
CONT SPEC 4OZ STER OR WHT (MISCELLANEOUS)
CONTAINER SPEC 2.5X3XGRAD LEK (MISCELLANEOUS) IMPLANT
DRAPE UTILITY 15X26 TOWEL STRL (DRAPES) ×3 IMPLANT
FIBER LASER TRAC TIP (UROLOGICAL SUPPLIES) IMPLANT
GLOVE BIOGEL PI IND STRL 7.5 (GLOVE) ×1 IMPLANT
GLOVE BIOGEL PI INDICATOR 7.5 (GLOVE) ×2
GOWN STRL REUS W/ TWL LRG LVL3 (GOWN DISPOSABLE) ×1 IMPLANT
GOWN STRL REUS W/ TWL XL LVL3 (GOWN DISPOSABLE) ×1 IMPLANT
GOWN STRL REUS W/TWL LRG LVL3 (GOWN DISPOSABLE) ×2
GOWN STRL REUS W/TWL XL LVL3 (GOWN DISPOSABLE) ×2
GUIDEWIRE STR DUAL SENSOR (WIRE) ×3 IMPLANT
INFUSOR MANOMETER BAG 3000ML (MISCELLANEOUS) ×3 IMPLANT
INTRODUCER DILATOR DOUBLE (INTRODUCER) IMPLANT
KIT TURNOVER CYSTO (KITS) ×3 IMPLANT
PACK CYSTO AR (MISCELLANEOUS) ×3 IMPLANT
SET CYSTO W/LG BORE CLAMP LF (SET/KITS/TRAYS/PACK) ×3 IMPLANT
SHEATH URETERAL 12FRX35CM (MISCELLANEOUS) IMPLANT
SOL .9 NS 3000ML IRR  AL (IV SOLUTION) ×2
SOL .9 NS 3000ML IRR UROMATIC (IV SOLUTION) ×1 IMPLANT
STENT URET 6FRX24 CONTOUR (STENTS) IMPLANT
STENT URET 6FRX26 CONTOUR (STENTS) IMPLANT
SURGILUBE 2OZ TUBE FLIPTOP (MISCELLANEOUS) ×3 IMPLANT
SYR 10ML LL (SYRINGE) ×3 IMPLANT
VALVE UROSEAL ADJ ENDO (VALVE) IMPLANT
WATER STERILE IRR 1000ML POUR (IV SOLUTION) ×3 IMPLANT

## 2019-05-18 NOTE — Progress Notes (Signed)
[facility] [place] NAME: Brent Carpenter    MR#:  [id number]  DATE OF BIRTH:  Aug 31, 1923  SUBJECTIVE:   Patient here after a fall and being on the floor for prolonged period of time.  Noted to have acute on chronic renal failure and also acute rhabdomyolysis.  Patient is very hard of hearing.  REVIEW OF SYSTEMS:    Review of Systems  Unable to perform ROS: Mental acuity    Nutrition: Heart Healthy Tolerating Diet: Yes Tolerating PT: Await Eval.   DRUG ALLERGIES:   Allergies  Allergen Reactions   Other Other (See Comments)    Newspaper - makes him sneeze    VITALS:  Blood pressure 123/85, pulse (!) 56, temperature (!) 97.5 F (36.4 C), temperature source Oral, resp. rate 18, height 5\' 11"  (1.803 m), weight 72.6 kg, SpO2 94 %.  PHYSICAL EXAMINATION:   Physical Exam  GENERAL:  85 y.o.-year-old patient sitting in chair in no acute distress but very hard of hearing.  EYES: Pupils equal, round, reactive to light and accommodation. No scleral icterus. Extraocular muscles intact.  HEENT: Head atraumatic, normocephalic. Oropharynx and nasopharynx clear.  NECK:  Supple, no jugular venous distention. No thyroid enlargement, no tenderness.  LUNGS: Normal breath sounds bilaterally, no wheezing, rales, rhonchi. No use of accessory muscles of respiration.  CARDIOVASCULAR: S1, S2 normal. No murmurs, rubs, or gallops.  ABDOMEN: Soft, nontender, nondistended. Bowel sounds present. No organomegaly or mass.  EXTREMITIES: No cyanosis, clubbing or edema b/l.    NEUROLOGIC: Cranial nerves II through XII are intact. No focal Motor or sensory deficits b/l.   PSYCHIATRIC: The patient is alert and oriented x 1.  SKIN: No obvious rash, lesion, or ulcer.    LABORATORY PANEL:   CBC Recent Labs  Lab 05/18/19 0420  WBC 15.5*  HGB 8.9*  HCT 27.9*  PLT 102*    ------------------------------------------------------------------------------------------------------------------  Chemistries  Recent Labs  Lab 05/18/19 0420  NA 148*  K 4.3  CL 114*  CO2 19*  GLUCOSE 94  BUN 93*  CREATININE 3.54*  CALCIUM 8.9  AST 69*  ALT 30  ALKPHOS 96  BILITOT 1.9*   ------------------------------------------------------------------------------------------------------------------  Cardiac Enzymes No results for input(s): TROPONINI in the last 168 hours. ------------------------------------------------------------------------------------------------------------------  RADIOLOGY:  Ct Abdomen Pelvis Wo Contrast  Result Date: 05/18/2019 CLINICAL DATA:  Hydronephrosis. EXAM: CT ABDOMEN AND PELVIS WITHOUT CONTRAST TECHNIQUE: Multidetector CT imaging of the abdomen and pelvis was performed following the standard protocol without IV contrast. COMPARISON:  Ultrasound of same day. FINDINGS: Lower chest: Small right pleural effusion is noted. Hepatobiliary: No focal liver abnormality is seen. No gallstones, gallbladder wall thickening, or biliary dilatation. Pancreas: Unremarkable. No pancreatic ductal dilatation or surrounding inflammatory changes. Spleen: Normal in size without focal abnormality. Adrenals/Urinary Tract: Adrenal glands appear normal. Right renal atrophy is noted with mild right hydroureteronephrosis without obstructing calculus. Moderate left hydroureteronephrosis is noted with nonobstructing 6 mm calculus seen in the distal left ureter. No definite evidence of obstructive calculus is noted. Moderate urinary bladder distention is noted concerning for bladder outlet obstruction. Stomach/Bowel: Stomach is within normal limits. Appendix appears normal. No evidence of bowel wall thickening, distention, or inflammatory changes. Vascular/Lymphatic: Atherosclerosis of abdominal aorta is noted. 2.9 cm left common iliac artery aneurysm is noted. No adenopathy is  noted. Reproductive: Mild prostatic enlargement is noted. Other: Midline surgical staples are noted anteriorly. Musculoskeletal: Severe degenerative changes seen involving the right hip. No acute abnormality  is noted. IMPRESSION: Moderate left hydroureteronephrosis is noted with nonobstructing 6 mm distal left ureteral calculus, but no evidence of obstructing ureteral calculus. Also noted is mild right hydroureteronephrosis without obstructing calculus. Moderate urinary bladder distention is noted concerning for bladder outlet obstruction. Mild prostatic enlargement is noted. 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 Small right pleural effusion. Aortic Atherosclerosis (ICD10-I70.0). Electronically Signed   By: Lupita Raider M.D.   On: 05/18/2019 12:50   Dg Chest 1 View  Result Date: 05/17/2019 CLINICAL DATA:  Patient was found down. The patient fell yesterday. EXAM: CHEST  1 VIEW COMPARISON:  03/16/2011 FINDINGS: The heart size is normal. Slight pulmonary vascular prominence. Tortuosity and calcification of the thoracic aorta. No discrete infiltrates or effusions. No acute bone abnormality. IMPRESSION: 1. Slight pulmonary vascular prominence. 2. Aortic atherosclerosis. Electronically Signed   By: Francene Boyers M.D.   On: 05/17/2019 17:01   Ct Head Wo Contrast  Result Date: 05/17/2019 CLINICAL DATA:  Fall EXAM: CT HEAD WITHOUT CONTRAST CT CERVICAL SPINE WITHOUT CONTRAST TECHNIQUE: Multidetector CT imaging of the head and cervical spine was performed following the standard protocol without intravenous contrast. Multiplanar CT image reconstructions of the cervical spine were also generated. COMPARISON:  MR brain, 11/27/2008 FINDINGS: CT HEAD FINDINGS Brain: No evidence of acute infarction, hemorrhage, hydrocephalus, extra-axial collection or mass lesion/mass effect. Periventricular and deep Yeomans matter hypodensity, global volume loss, and enlargement of the lateral ventricles ex vacuo. Nonacute  encephalomalacia of the left parietal lobe (series 2, image 24). Vascular: No hyperdense vessel or unexpected calcification. 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 Skull: Normal. Negative for fracture or focal lesion. Sinuses/Orbits: No acute finding. Other: None. CT CERVICAL SPINE FINDINGS Alignment: Degenerative straightening of the normal cervical lordosis. Skull base and vertebrae: No acute fracture. No primary bone lesion or focal pathologic process. Soft tissues and spinal canal: No prevertebral fluid or swelling. No visible canal hematoma. Disc levels: Severe disc degenerative disease and osteophytosis of the lower cervical spine. Upper chest: Partially imaged right pleural effusion. Other: None. IMPRESSION: 1.  No acute intracranial pathology. 2. Small-vessel Loureiro matter disease, global volume loss, enlargement of the lateral ventricles ex vacuo, progressed in comparison to examination dated 11/27/2008. 3.  Nonacute encephalomalacia of the left parietal lobe. 4. 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 5.  No fracture or static subluxation of the cervical spine. 6. Severe disc degenerative disease and osteophytosis of the lower cervical spine. 7. Partially imaged right pleural effusion. Electronically Signed   By: Lauralyn Primes M.D.   On: 05/17/2019 15:19   Ct Cervical Spine Wo Contrast  Result Date: 05/17/2019 CLINICAL DATA:  Fall EXAM: CT HEAD WITHOUT CONTRAST CT CERVICAL SPINE WITHOUT CONTRAST TECHNIQUE: Multidetector CT imaging of the head and cervical spine was  performed following the standard protocol without intravenous contrast. Multiplanar CT image reconstructions of the cervical spine were also generated. COMPARISON:  MR brain, 11/27/2008 FINDINGS: CT HEAD FINDINGS Brain: No evidence of acute infarction, hemorrhage, hydrocephalus, extra-axial collection or mass lesion/mass effect. Periventricular and  deep Passarella matter hypodensity, global volume loss, and enlargement of the lateral ventricles ex vacuo. Nonacute encephalomalacia of the left parietal lobe (series 2, image 24). Vascular: No hyperdense vessel or unexpected calcification. 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 Skull: Normal. Negative for fracture or focal lesion. Sinuses/Orbits: No acute finding. Other: None. CT CERVICAL SPINE FINDINGS Alignment: Degenerative straightening of the normal cervical lordosis. Skull base and vertebrae: No acute fracture. No primary bone lesion or focal pathologic process. Soft tissues and spinal canal: No prevertebral fluid or swelling. No visible canal hematoma. Disc levels: Severe disc degenerative disease and osteophytosis of the lower cervical spine. Upper chest: Partially imaged right pleural effusion. Other: None. IMPRESSION: 1.  No acute intracranial pathology. 2. Small-vessel Kirchman matter disease, global volume loss, enlargement of the lateral ventricles ex vacuo, progressed in comparison to examination dated 11/27/2008. 3.  Nonacute encephalomalacia of the left parietal lobe. 4. 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 5.  No fracture  or static subluxation of the cervical spine. 6. Severe disc degenerative disease and osteophytosis of the lower cervical spine. 7. Partially imaged right pleural effusion. Electronically Signed   By: Lauralyn PrimesAlex  Bibbey M.D.   On: 05/17/2019 15:19   [place] Renal  Result Date: 05/18/2019 CLINICAL DATA:  Acute renal failure. EXAM: RENAL / URINARY TRACT ULTRASOUND COMPLETE COMPARISON:  Renal ultrasound 10/10/2018. FINDINGS: Right Kidney: Renal measurements: 8.6 x 5.0 x 5.0 cm = volume: 111 mL. The cortex is thinned with increased echogenicity. Mild hydronephrosis is noted. 1.3 cm cyst off the upper pole noted. Left Kidney: Renal measurements: 10.8 x 7.3 x 5.9 cm = volume: 243.5 mL. Moderate to moderately severe hydronephrosis appears worse than on the prior exam. Cortical echogenicity is increased. Bladder: Distended. Debris is seen in the urinary bladder. Bilateral ureteral jets are visualized. IMPRESSION: Mild right hydronephrosis is new since the prior examination. Moderate to moderately severe left hydronephrosis appears worse than on the prior study. Bilateral ureteral jets are seen. Cortical echogenicity of the kidneys is increased bilaterally compatible with medical renal disease. Distended urinary bladder with debris layering dependently. Electronically Signed   By: Drusilla Kannerhomas  Dalessio M.D.   On: 05/18/2019 10:32     ASSESSMENT AND PLAN:   85 yo male w/ hx of Glaucoma, Tremor, very hard of hearing who presented to the hospital after a fall and noted to be found on the floor for prolonged period of time.   ** Acute renal failure in CKD III- likely due to rhabdomyolysis and dehydration. Creatinine 3.48. Baseline creatinine 1.6-1.9. -Continue IV fluids, renal ultrasound suggestive of hydronephrosis and therefore CT abdomen pelvis obtained which shows moderate to severe left-sided hydronephrosis with some right-sided hydronephrosis with nephrolithiasis which is nonobstructing.  Possible bladder outlet  obstruction. - We will get urology consult/Nephrology consult.  - follow BUN/Cr. Renal dose meds and avoid nephrotoxins.   ** Mild rhabdomyolysis- after being found down on the ground. CK's trending down.  - cont. IV fluids.   ** History of falls- patient has multiple bruises on his hands and  arms.  States this fall occurred due to tripping on the carpet. -CT head and C-spine were unremarkable for acute abnormalities - Pt eval noted and pt. Will need SNF/STR upon discharge.   ** Elevated troponin- likely due to demand ischemia. No active chest pain.  Troponin 131 > 111. -Continue home aspirin 81 mg daily  ** Hypernatremia- due to hydration - cont. IV fluids and it's improving.   ** Leukocytosis- may be reactive.  No signs of infection. - UA & CXR (-)   ** Elevated AST-due to rhabdomyolysis, will continue to monitor.  Continue IV fluids.  ** Normocytic anemia- no signs of bleeding. - anemia panel consistent with anemia of chronic disease.  - no acute need for transfusion.   ** Hypertension- cont. Norvasc. BP stable.   ** Tremor- chronic issue for him -Continue home propranolol 60 mg daily   All the records are reviewed and case discussed with Care Management/Social Worker. Management plans discussed with the patient, family and they are in agreement.  CODE STATUS: DNR  DVT Prophylaxis: Hep. SQ  TOTAL TIME TAKING CARE OF THIS PATIENT: 30 minutes.   POSSIBLE D/C IN 2-3 DAYS, DEPENDING ON CLINICAL CONDITION.   Houston Siren M.D on 05/18/2019 at 2:25 PM  Between 7am to 6pm - Pager - [phone]  After 6pm go to [email or web address] - Social research officer, government  [facility]  Office  [phone]  CC: Primary care physician; Jerl Mina, MD

## 2019-05-18 NOTE — Care Management (Signed)
RNCM assessment completed full note to follow 

## 2019-05-18 NOTE — Anesthesia Preprocedure Evaluation (Deleted)
Anesthesia Evaluation  Patient identified by MRN, date of birth, ID band Patient awake    Reviewed: Allergy & Precautions, H&P , NPO status , Patient's Chart, lab work & pertinent test results, reviewed documented beta blocker date and time   Airway Mallampati: II  TM Distance: >3 FB Neck ROM: full    Dental  (+) Teeth Intact   Pulmonary neg pulmonary ROS, former smoker,    Pulmonary exam normal        Cardiovascular Exercise Tolerance: Good negative cardio ROS Normal cardiovascular exam Rate:Normal     Neuro/Psych negative neurological ROS  negative psych ROS   GI/Hepatic negative GI ROS, Neg liver ROS,   Endo/Other  negative endocrine ROS  Renal/GU Renal disease  negative genitourinary   Musculoskeletal   Abdominal   Peds  Hematology negative hematology ROS (+)   Anesthesia Other Findings   Reproductive/Obstetrics negative OB ROS                             Anesthesia Physical Anesthesia Plan  ASA: III and emergent  Anesthesia Plan: General LMA   Post-op Pain Management:    Induction:   PONV Risk Score and Plan: 3  Airway Management Planned:   Additional Equipment:   Intra-op Plan:   Post-operative Plan:   Informed Consent: I have reviewed the patients History and Physical, chart, labs and discussed the procedure including the risks, benefits and alternatives for the proposed anesthesia with the patient or authorized representative who has indicated his/her understanding and acceptance.       Plan Discussed with: CRNA  Anesthesia Plan Comments:         Anesthesia Quick Evaluation

## 2019-05-18 NOTE — Progress Notes (Signed)
Update given to patient's Son, Marden Noble over the phone. Son is up from [place] where he currently lives. Per dough patient has a caregiver from [place] that comes to assist patient three times a week 1:00 to 3:00 pm. The caregiver is the individual that found the patient in the floor. Marden Noble stated there is no family near by and is concerned with his father continue to stay home alone most of the time.   Fuller Mandril, RN

## 2019-05-18 NOTE — Evaluation (Signed)
Physical Therapy Evaluation Patient Details Name: Brent Safelliott S Suddreth Jr. MRN: [id number] DOB: [birth year] Today's Date: 05/18/2019   History of Present Illness  Per MD: Brent Carpenter  is a 85 y.o. male with no significant past medical history who presented to the ED after being found down by a home health provider. Patient is a little confused, so history is obtained from the chart and the ED provider. Patient apparently thinks he may have tripped on thick carpet. He denies any chest pain, shortness of breath, abdominal pain, hip pain, knee pain. In the ED, he was hypertensive.  Vitals were otherwise unremarkable. CT head and C-spine were negative for acute abnormalities, admitted with ARF and mild rhabdo. PMH includes: Falls, cardioversion, substance abuse, tremor.  Clinical Impression  Pt presented with deficits in strength, transfers, mobility, gait, and balance. Pt is HOH and had a hard time answering questions directly. Pt was very eager to get sitting up on the EOB and completed seated exercises with min A and cuing. Once seated on EOB pt completed LAQ, but on 2 occasions caused a LOB as he kicked his foot out. Pt completed sit <> stand with extensive cuing and min A to maintain balance and control of the RW. Pt ambulation was slow and unsteady and required min A to prevent post LOB during turning. Pt will benefit from PT services in a SNF setting upon discharge to safely address above deficits for decreased caregiver assistance and eventual return to PLOF.       Follow Up Recommendations SNF    Equipment Recommendations       Recommendations for Other Services       Precautions / Restrictions Precautions Precautions: Fall Precaution Comments: High Fall Risk Restrictions Weight Bearing Restrictions: No      Mobility  Bed Mobility Overal bed mobility: Needs Assistance Bed Mobility: Rolling;Supine to Sit Rolling: Supervision   Supine to sit: Min assist     General bed mobility  comments: Pt was eager to sit up on the EOB, but still needed verbal cuing to cordinate movement and min A to complete sitting up  Transfers Overall transfer level: Needs assistance Equipment used: Rolling walker (2 wheeled) Transfers: Sit to/from Stand Sit to Stand: Min assist;+2 safety/equipment         General transfer comment: Pt required extensive cuing to safely coordinate standing with the RW requiring min A to prevent LOB x2  Ambulation/Gait Ambulation/Gait assistance: Min assist Gait Distance (Feet): 12 Feet Assistive device: Rolling walker (2 wheeled) Gait Pattern/deviations: Decreased step length - right;Decreased step length - left;Step-through pattern;Shuffle Gait velocity: Decreased   General Gait Details: Pt pushed RW far out in front of him, leaned far forward and then shuffled quickly after it. Pt required min A twice to prevent post LOB during turns.  Stairs            Wheelchair Mobility    Modified Rankin (Stroke Patients Only)       Balance Overall balance assessment: Needs assistance Sitting-balance support: Single extremity supported;Feet supported Sitting balance-Leahy Scale: Poor Sitting balance - Comments: Pt required asst to prevent post LOB with even minor perturbations Postural control: Posterior lean Standing balance support: Bilateral upper extremity supported Standing balance-Leahy Scale: Poor                               Pertinent Vitals/Pain Pain Assessment: No/denies pain(Pt was asked several times, and consistently said 'no')  Home Living Family/patient expects to be discharged to:: Private residence(Pt was a poor historian, information was second-hand and at least 32 months old from a friend) Living Arrangements: Non-relatives/Friends Available Help at Discharge: Personal care attendant(MWF, 1-3 PM) Type of Home: House Home Access: Stairs to enter Entrance Stairs-Rails: Right Entrance Stairs-Number of Steps:  4(Pt was a poor historian, information was second-hand and at least 73 months old from a friend) Home Layout: One level(Pt was a poor historian, information was second-hand and at least 23 months old from a friend) Home Equipment: Environmental consultant - 2 wheels      Prior Function Level of Independence: Independent with assistive device(s)         Comments: Pt was a poor historian, but it sounds like he lives alone and has some help in-home help 3 days a week, caregiver helps with companionship and errand running     Hand Dominance        Extremity/Trunk Assessment   Upper Extremity Assessment Upper Extremity Assessment: Generalized weakness    Lower Extremity Assessment Lower Extremity Assessment: Generalized weakness    Cervical / Trunk Assessment Cervical / Trunk Assessment: Kyphotic  Communication   Communication: HOH  Cognition Arousal/Alertness: Awake/alert Behavior During Therapy: Impulsive Overall Cognitive Status: No family/caregiver present to determine baseline cognitive functioning                                 General Comments: Pt is HOH and had difficulty engaging and answering any questions, but became more talkative after 10-15 minutes      General Comments      Exercises Total Joint Exercises Ankle Circles/Pumps: AROM;Both;10 reps Quad Sets: AAROM;Both;5 reps Heel Slides: AAROM;Both;10 reps Knee Flexion: AAROM;Both;10 reps Other Exercises Other Exercises: Pt complete sit <> stand transfers and was able to ambulate to recliner with close CGA   Assessment/Plan    PT Assessment Patient needs continued PT services  PT Problem List Decreased strength;Decreased safety awareness;Decreased mobility;Decreased balance       PT Treatment Interventions DME instruction;Gait training;Stair training;Functional mobility training;Therapeutic activities;Therapeutic exercise;Balance training;Patient/family education    PT Goals (Current goals can be found  in the Care Plan section)  Acute Rehab PT Goals Patient Stated Goal: Return home PT Goal Formulation: With patient Time For Goal Achievement: 05/31/19 Potential to Achieve Goals: Fair    Frequency Min 2X/week   Barriers to discharge        Co-evaluation               AM-PAC PT "6 Clicks" Mobility  Outcome Measure Help needed turning from your back to your side while in a flat bed without using bedrails?: A Lot Help needed moving from lying on your back to sitting on the side of a flat bed without using bedrails?: A Lot Help needed moving to and from a bed to a chair (including a wheelchair)?: A Lot Help needed standing up from a chair using your arms (e.g., wheelchair or bedside chair)?: A Lot Help needed to walk in hospital room?: A Little Help needed climbing 3-5 steps with a railing? : Total 6 Click Score: 12    End of Session Equipment Utilized During Treatment: Gait belt Activity Tolerance: Patient tolerated treatment well Patient left: in chair;with chair alarm set;Other (comment)(Dr. Cherlynn Kaiser was evaluting the pt) Nurse Communication: Mobility status PT Visit Diagnosis: Unsteadiness on feet (R26.81);Difficulty in walking, not elsewhere classified (R26.2);Repeated falls (R29.6)  Time: 1057-1130 PT Time Calculation (min) (ACUTE ONLY): 33 min   Charges:             Juanda Crumble "Gus" Jeannette Corpus, SPT  05/18/19, 2:17 PM

## 2019-05-18 NOTE — Consult Note (Addendum)
05/18/19 3:21 PM   Brent Palm Jr. 1924-03-30 [id number]  CC: Acute on chronic renal failure, bilateral hydronephrosis, left ureteral stone  HPI: I saw Mr. Fanton in consultation for bilateral hydronephrosis and a left distal ureteral stone from Dr. Verdell Carmine.  He is a 85 year old male that was found down at home yesterday after suspected fall and admitted to the hospitalist service for rhabdomyolysis and acute on chronic renal failure with creatinine 3.5 from baseline of 1.9.  CT scan performed today showed severe right renal atrophy with mild right hydroureteronephrosis down to a distended bladder, as well as moderate left hydroureteronephrosis with a 6 mm left distal ureteral stone.  The bladder is significantly distended.  Of note, he had a renal ultrasound in February 2020 that showed moderate left hydronephrosis down to distended bladder with a postvoid residual of 818 mL.  Urinalysis was benign appearing with 0-5 RBCs, 0-5 WBCs, no bacteria, nitrite negative.  He has been afebrile, however has a leukocytosis to 15 K.   The patient is a very poor historian and appears quite demented, but his son Marden Noble is at the bedside to help with communication.  Essentially, the patient has no pain and specifically denies any flank or pelvic pain.  He does report a 1 year history of incontinence that I suspect is overflow incontinence based on his prior ultrasound in February 2020 with similar hydronephrosis findings.  He denies any gross hematuria.  He denies any history of UTIs or prior Foley placement.  He surprisingly still lives alone.  Troponin is 111, down slightly from 131 earlier today.  CK also downtrending 1000 today from 2300 yesterday afternoon.   PMH: Past Medical History:  Diagnosis Date  . Aneurysm ([place])    In 1970's, LUQ    Surgical History: History reviewed. No pertinent surgical history.   Allergies:  Allergies  Allergen Reactions  . Other Other (See Comments)    Newspaper  - makes him sneeze    Family History: History reviewed. No pertinent family history.  Social History:  reports that he has quit smoking. He has never used smokeless tobacco. He reports previous alcohol use. He reports that he does not use drugs.  ROS: Please see flowsheet from today's date for complete review of systems.  Physical Exam: BP 123/85 (BP Location: Left Arm)   Pulse (!) 56   Temp (!) 97.5 F (36.4 C) (Oral)   Resp 18   Ht 5\' 11"  (1.803 m)   Wt 72.6 kg   SpO2 94%   BMI 22.32 kg/m     Constitutional: Elderly, frail and cachectic appearing Cardiac: Regular rate and rhythm Pulmonary: Easy respiratory effort GI: Abdomen is soft, nontender, nondistended, no abdominal masses Skin: Multiple bruises throughout the upper and lower extremities Neurologic: Moving all extremities Psych: Tangential thoughts, pleasantly confused  Laboratory Data: Reviewed, see HPI  Pertinent Imaging: Reviewed, see HPI  Assessment & Plan:   In summary, the patient is a 85 year old extremely frail appearing male admitted with acute on chronic renal failure with CT today showing bilateral hydroureteronephrosis down to a very distended bladder, atrophic right kidney, and 6 mm left distal ureteral stone.  I had a long conversation with the patient and his son Marden Noble about these findings.  I feel the main component of his acute on chronic renal failure is bladder outlet obstruction with bilateral hydronephrosis, however it is possible that the distal ureteral stone may be contributing as well.  Notably, there is distended ureter below the  left ureteral stone indicating the obstruction is of bladder origin.  I had a long conversation with the patient and his son about options for management.  Option 1 would be to do nothing with likely ongoing urinary incontinence and likely worsening renal failure and ultimately death.  Option 2 would be a Foley catheter to drain the bladder, monitor for postobstructive  diuresis, and see if his renal function improves.  There is a possibility that with Flomax this left ureteral stone could pass spontaneously without surgical intervention.  Option 3 would be proceeding to the operating room for left ureteroscopy, laser lithotripsy, stent placement, and Foley placement to optimize his renal function.  With his age, elevated troponin, and frailty there are certainly risk to proceeding to the operating room under anesthesia.  He has no clinical or laboratory signs of infection.  -Start Flomax -Place Foley catheter, monitor for post-obstructive diuresis -Trend creatinine -Make n.p.o. at midnight -Urology will continue to follow, we will re-discuss his case with his son tomorrow to determine how they would like to proceed pending his renal function response to catheter placement  Sondra Come, MD  [facility] [address] [place], [place] [address] [phone]

## 2019-05-18 NOTE — Progress Notes (Signed)
[facility]  ROUNDING NOTE   Subjective:  Patient seen at bedside. Known to [place] from the office.  Experienced a fall and was brought in. Patient confused and hard of hearing. Baseline creatinine is 2.18 with an EGFR 25. Creatinine upon admission was 3.48 and now up to 3.54. Hyponatremia also noted with a serum sodium of 148. CK was 1012. Ultrasound revealed severe left-sided hydronephrosis and mild right-sided hydronephrosis. Urology has been consulted.    Objective:  Vital signs in last 24 hours:  Temp:  [97.5 F (36.4 C)-97.8 F (36.6 C)] 97.5 F (36.4 C) (09/24 1232) Pulse Rate:  [48-86] 56 (09/24 1232) Resp:  [16-20] 18 (09/24 1232) BP: (123-169)/(50-93) 123/85 (09/24 1232) SpO2:  [87 %-100 %] 94 % (09/24 1232)  Weight change:  Filed Weights   05/17/19 1359  Weight: 72.6 kg    Intake/Output: No intake/output data recorded.   Intake/Output this shift:  Total I/O In: 240 [P.O.:240] Out: 200 [Urine:200]  Physical Exam: General: No acute distress  Head: Normocephalic, atraumatic. Moist oral mucosal membranes  Eyes: Anicteric  Neck: Supple, trachea midline  Lungs:  Clear to auscultation, normal effort  Heart: S1S2 no rubs  Abdomen:  Soft, nontender, bowel sounds present  Extremities: Trace peripheral edema.  Neurologic: Awake, very hard of hearing  Skin: No lesions       Basic Metabolic Panel: Recent Labs  Lab 05/17/19 1406 05/18/19 0420  NA 147* 148*  K 4.7 4.3  CL 112* 114*  CO2 18* 19*  GLUCOSE 75 94  BUN 83* 93*  CREATININE 3.48* 3.54*  CALCIUM 9.5 8.9    Liver Function Tests: Recent Labs  Lab 05/17/19 1406 05/18/19 0420  AST 83* 69*  ALT 28 30  ALKPHOS 103 96  BILITOT 2.9* 1.9*  PROT 7.0 6.1*  ALBUMIN 3.6 3.3*   No results for input(s): LIPASE, AMYLASE in the last 168 hours. No results for input(s): AMMONIA in the last 168 hours.  CBC: Recent Labs  Lab 05/17/19 1406 05/18/19 0420  WBC 15.7* 15.5*  NEUTROABS  13.4*  --   HGB 9.7* 8.9*  HCT 30.5* 27.9*  MCV 90.8 90.9  PLT 98* 102*    Cardiac Enzymes: Recent Labs  Lab 05/17/19 1406 05/18/19 0420  CKTOTAL 2,305* 1,012*    BNP: Invalid input(s): POCBNP  CBG: No results for input(s): GLUCAP in the last 168 hours.  Microbiology: Results for orders placed or performed during the hospital encounter of 05/17/19  SARS Coronavirus 2 [facility] order, Performed in [facility] hospital lab) Nasopharyngeal Nasopharyngeal Swab     Status: None   Collection Time: 05/17/19  3:17 PM   Specimen: Nasopharyngeal Swab  Result Value Ref Range Status   SARS Coronavirus 2 NEGATIVE NEGATIVE Final    Comment: (NOTE) If result is NEGATIVE SARS-CoV-2 target nucleic acids are NOT DETECTED. The SARS-CoV-2 RNA is generally detectable in upper and lower  respiratory specimens during the acute phase of infection. The lowest  concentration of SARS-CoV-2 viral copies this assay can detect is 250  copies / mL. A negative result does not preclude SARS-CoV-2 infection  and should not be used as the sole basis for treatment or other  patient management decisions.  A negative result may occur with  improper specimen collection / handling, submission of specimen other  than nasopharyngeal swab, presence of viral mutation(s) within the  areas targeted by this assay, and inadequate number of viral copies  (<250 copies / mL). A negative result must be  combined with clinical  observations, patient history, and epidemiological information. If result is POSITIVE SARS-CoV-2 target nucleic acids are DETECTED. The SARS-CoV-2 RNA is generally detectable in upper and lower  respiratory specimens dur ing the acute phase of infection.  Positive  results are indicative of active infection with SARS-CoV-2.  Clinical  correlation with patient history and other diagnostic information is  necessary to determine patient infection status.  Positive results do  not rule out bacterial  infection or co-infection with other viruses. If result is PRESUMPTIVE POSTIVE SARS-CoV-2 nucleic acids MAY BE PRESENT.   A presumptive positive result was obtained on the submitted specimen  and confirmed on repeat testing.  While 2019 novel coronavirus  (SARS-CoV-2) nucleic acids may be present in the submitted sample  additional confirmatory testing may be necessary for epidemiological  and / or clinical management purposes  to differentiate between  SARS-CoV-2 and other Sarbecovirus currently known to infect humans.  If clinically indicated additional testing with an alternate test  methodology [phone]) is advised. The SARS-CoV-2 RNA is generally  detectable in upper and lower respiratory sp ecimens during the acute  phase of infection. The expected result is Negative. Fact Sheet for Patients:  StrictlyIdeas.no Fact Sheet for Healthcare Providers: BankingDealers.co.za This test is not yet approved or cleared by the [place] FDA and has been authorized for detection and/or diagnosis of SARS-CoV-2 by FDA under an Emergency Use Authorization (EUA).  This EUA will remain in effect (meaning this test can be used) for the duration of the COVID-19 declaration under Section 564(b)(1) of the Act, 21 U.S.C. section 360bbb-3(b)(1), unless the authorization is terminated or revoked sooner. Performed at [facility], [address] [place], [place] [address]     Coagulation Studies: No results for input(s): LABPROT, INR in the last 72 hours.  Urinalysis: Recent Labs    05/17/19 1835  COLORURINE YELLOW*  LABSPEC 1.011  PHURINE 5.0  GLUCOSEU NEGATIVE  HGBUR MODERATE*  BILIRUBINUR NEGATIVE  KETONESUR NEGATIVE  PROTEINUR NEGATIVE  NITRITE NEGATIVE  LEUKOCYTESUR NEGATIVE      Imaging: Ct Abdomen Pelvis Wo Contrast  Result Date: 05/18/2019 CLINICAL DATA:  Hydronephrosis. EXAM: CT ABDOMEN AND PELVIS WITHOUT CONTRAST  TECHNIQUE: Multidetector CT imaging of the abdomen and pelvis was performed following the standard protocol without IV contrast. COMPARISON:  Ultrasound of same day. FINDINGS: Lower chest: Small right pleural effusion is noted. Hepatobiliary: No focal liver abnormality is seen. No gallstones, gallbladder wall thickening, or biliary dilatation. Pancreas: Unremarkable. No pancreatic ductal dilatation or surrounding inflammatory changes. Spleen: Normal in size without focal abnormality. Adrenals/Urinary Tract: Adrenal glands appear normal. Right renal atrophy is noted with mild right hydroureteronephrosis without obstructing calculus. Moderate left hydroureteronephrosis is noted with nonobstructing 6 mm calculus seen in the distal left ureter. No definite evidence of obstructive calculus is noted. Moderate urinary bladder distention is noted concerning for bladder outlet obstruction. Stomach/Bowel: Stomach is within normal limits. Appendix appears normal. No evidence of bowel wall thickening, distention, or inflammatory changes. Vascular/Lymphatic: Atherosclerosis of abdominal aorta is noted. 2.9 cm left common iliac artery aneurysm is noted. No adenopathy is noted. Reproductive: Mild prostatic enlargement is noted. Other: Midline surgical staples are noted anteriorly. Musculoskeletal: Severe degenerative changes seen involving the right hip. No acute abnormality is noted. IMPRESSION: Moderate left hydroureteronephrosis is noted with nonobstructing 6 mm distal left ureteral calculus, but no evidence of obstructing ureteral calculus. Also noted is mild right hydroureteronephrosis without obstructing calculus. Moderate urinary bladder distention is noted concerning for  bladder outlet obstruction. Mild prostatic enlargement is noted. 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 Small right pleural effusion. Aortic Atherosclerosis (ICD10-I70.0). Electronically Signed   By: Marijo Conception M.D.   On: 05/18/2019 12:50   Dg  Chest 1 View  Result Date: 05/17/2019 CLINICAL DATA:  Patient was found down. The patient fell yesterday. EXAM: CHEST  1 VIEW COMPARISON:  03/16/2011 FINDINGS: The heart size is normal. Slight pulmonary vascular prominence. Tortuosity and calcification of the thoracic aorta. No discrete infiltrates or effusions. No acute bone abnormality. IMPRESSION: 1. Slight pulmonary vascular prominence. 2. Aortic atherosclerosis. Electronically Signed   By: Lorriane Shire M.D.   On: 05/17/2019 17:01   Ct Head Wo Contrast  Result Date: 05/17/2019 CLINICAL DATA:  Fall EXAM: CT HEAD WITHOUT CONTRAST CT CERVICAL SPINE WITHOUT CONTRAST TECHNIQUE: Multidetector CT imaging of the head and cervical spine was performed following the standard protocol without intravenous contrast. Multiplanar CT image reconstructions of the cervical spine were also generated. COMPARISON:  MR brain, 11/27/2008 FINDINGS: CT HEAD FINDINGS Brain: No evidence of acute infarction, hemorrhage, hydrocephalus, extra-axial collection or mass lesion/mass effect. Periventricular and deep Langseth matter hypodensity, global volume loss, and enlargement of the lateral ventricles ex vacuo. Nonacute encephalomalacia of the left parietal lobe (series 2, image 24). Vascular: No hyperdense vessel or unexpected calcification. 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 Skull: Normal. Negative for fracture or focal lesion. Sinuses/Orbits: No acute finding. Other: None. CT CERVICAL SPINE FINDINGS Alignment: Degenerative straightening of the normal cervical lordosis. Skull base and vertebrae: No acute fracture. No primary bone lesion or focal pathologic process. Soft tissues and spinal canal: No prevertebral fluid or swelling. No visible canal hematoma. Disc  levels: Severe disc degenerative disease and osteophytosis of the lower cervical spine. Upper chest: Partially imaged right pleural effusion. Other: None. IMPRESSION: 1.  No acute intracranial pathology. 2. Small-vessel Tiberio matter disease, global volume loss, enlargement of the lateral ventricles ex vacuo, progressed in comparison to examination dated 11/27/2008. 3.  Nonacute encephalomalacia of the left parietal lobe. 4. 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 5.  No fracture or static subluxation of the cervical spine. 6. Severe disc degenerative disease and osteophytosis of the lower cervical spine. 7. Partially imaged right pleural effusion. Electronically Signed   By: Eddie Candle M.D.   On: 05/17/2019 15:19   Ct Cervical Spine Wo Contrast  Result Date: 05/17/2019 CLINICAL DATA:  Fall EXAM: CT HEAD WITHOUT CONTRAST CT CERVICAL SPINE WITHOUT CONTRAST TECHNIQUE: Multidetector CT imaging of the head and cervical spine was performed following the standard protocol without intravenous contrast. Multiplanar CT image reconstructions of the cervical spine were also generated. COMPARISON:  MR brain, 11/27/2008 FINDINGS: CT HEAD FINDINGS Brain: No evidence of acute infarction, hemorrhage, hydrocephalus, extra-axial collection or mass lesion/mass effect. Periventricular and deep Craigie matter hypodensity, global volume loss, and enlargement of the lateral ventricles ex vacuo. Nonacute encephalomalacia of the left parietal lobe (series 2, image 24). Vascular: No hyperdense vessel or unexpected calcification. There is a densely calcified aneurysm of  the V4 segment of the right vertebral artery measuring at least 2.0 x 1.1 x 0.9 cm (series 9, image 26, series 8, image 45), difficult to directly compare  to remote prior MRI although perhaps slightly enlarged over time. Skull: Normal. Negative for fracture or focal lesion. Sinuses/Orbits: No acute finding. Other: None. CT CERVICAL SPINE FINDINGS Alignment: Degenerative straightening of the normal cervical lordosis. Skull base and vertebrae: No acute fracture. No primary bone lesion or focal pathologic process. Soft tissues and spinal canal: No prevertebral fluid or swelling. No visible canal hematoma. Disc levels: Severe disc degenerative disease and osteophytosis of the lower cervical spine. Upper chest: Partially imaged right pleural effusion. Other: None. IMPRESSION: 1.  No acute intracranial pathology. 2. Small-vessel Hojnacki matter disease, global volume loss, enlargement of the lateral ventricles ex vacuo, progressed in comparison to examination dated 11/27/2008. 3.  Nonacute encephalomalacia of the left parietal lobe. 4. 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 5.  No fracture or static subluxation of the cervical spine. 6. Severe disc degenerative disease and osteophytosis of the lower cervical spine. 7. Partially imaged right pleural effusion. Electronically Signed   By: Eddie Candle M.D.   On: 05/17/2019 15:19   US Renal  Result Date: 05/18/2019 CLINICAL DATA:  Acute renal failure. EXAM: RENAL / URINARY TRACT ULTRASOUND COMPLETE COMPARISON:  Renal ultrasound 10/10/2018. FINDINGS: Right Kidney: Renal measurements: 8.6 x 5.0 x 5.0 cm = volume: 111 mL. The cortex is thinned with increased echogenicity. Mild hydronephrosis is noted. 1.3 cm cyst off the upper pole noted. Left Kidney: Renal measurements: 10.8 x 7.3 x 5.9 cm = volume: 243.5 mL. Moderate to moderately severe hydronephrosis appears worse than on the prior exam. Cortical  echogenicity is increased. Bladder: Distended. Debris is seen in the urinary bladder. Bilateral ureteral jets are visualized. IMPRESSION: Mild right hydronephrosis is new since the prior examination. Moderate to moderately severe left hydronephrosis appears worse than on the prior study. Bilateral ureteral jets are seen. Cortical echogenicity of the kidneys is increased bilaterally compatible with medical renal disease. Distended urinary bladder with debris layering dependently. Electronically Signed   By: Inge Rise M.D.   On: 05/18/2019 10:32     Medications:   . sodium chloride 100 mL/hr at 05/17/19 1858   . amLODipine  5 mg Oral Daily  . aspirin EC  81 mg Oral Daily  . brimonidine  1 drop Both Eyes BID  . heparin  5,000 Units Subcutaneous Q8H  . propranolol  60 mg Oral Daily  . tamsulosin  0.4 mg Oral Daily   acetaminophen **OR** acetaminophen, haloperidol lactate, labetalol, ondansetron **OR** ondansetron (ZOFRAN) IV, polyethylene glycol, traMADol  Assessment/ Plan:  85 y.o. male with past medical history of hypertension, chronic kidney disease stage IV baseline creatinine 2.1, hard of hearing status who presents now with fall and acute renal failure.  1.  Acute renal failure in part secondary to hydronephrosis. 2.  Chronic kidney disease stage IV baseline creatinine 2.1. 3.  Bilateral hydronephrosis left greater than right. 4.  Anemia of chronic kidney disease hemoglobin 8.9.  Plan: Patient presents status post fall.  He is now found to have hydronephrosis which is severe on the left and mild on the right.  Distended urinary bladder noted as well.  Therefore bladder outlet obstruction is a consideration.  Urology has been consulted.  Further plan to be determined further evaluation and treatment.  No urgent indication for dialysis as renal function should partially improve with relief of obstruction.  Further plan as patient progresses.  Thanks for consultation.    LOS:  1 Mounir Skipper 9/24/20203:53 PM

## 2019-05-18 NOTE — [place] FL2 (Signed)
[place] MEDICAID FL2 LEVEL OF CARE SCREENING TOOL     IDENTIFICATION  Patient Name: Brent Carpenter. Birthdate: 1924/01/16 Sex: male Admission Date (Current Location): 05/17/2019  [facility] and [place] Number:  Chiropodist and Address:         Provider Number: [id number]  Attending Physician Name and Address:  Houston Siren, MD  Relative Name and Phone Number:       Current Level of Care: Hospital Recommended Level of Care: Skilled Nursing Facility Prior Approval Number:    Date Approved/Denied:   PASRR Number: [id number]  Discharge Plan: SNF    Current Diagnoses: Patient Active Problem List   Diagnosis Date Noted  . Acute renal failure (ARF) (HCC) 05/17/2019    Orientation RESPIRATION BLADDER Height & Weight     Self, Situation, Place  O2(2L  [place]) Incontinent Weight: 72.6 kg Height:  5\' 11"  (180.3 cm)  BEHAVIORAL SYMPTOMS/MOOD NEUROLOGICAL BOWEL NUTRITION STATUS      Incontinent Diet(NPO will advanced prior to discharge)  AMBULATORY STATUS COMMUNICATION OF NEEDS Skin   Extensive Assist Verbally Skin abrasions, Bruising                       Personal Care Assistance Level of Assistance  Dressing, Bathing Bathing Assistance: Limited assistance   Dressing Assistance: Limited assistance     Functional Limitations Info  Hearing   Hearing Info: Impaired      SPECIAL CARE FACTORS FREQUENCY  PT (By licensed PT), OT (By licensed OT)                    Contractures Contractures Info: Not present    Additional Factors Info  Code Status, Allergies Code Status Info: Full Allergies Info: Newspaper - makes him sneeze           Current Medications (05/18/2019):  This is the current hospital active medication list Current Facility-Administered Medications  Medication Dose Route Frequency Provider Last Rate Last Dose  . 0.9 %  sodium chloride infusion   Intravenous Continuous Mayo, 05/20/2019, MD 100 mL/hr at 05/18/19 1608    .  acetaminophen (TYLENOL) tablet 650 mg  650 mg Oral Q6H PRN Mayo, 05/20/19, MD       Or  . acetaminophen (TYLENOL) suppository 650 mg  650 mg Rectal Q6H PRN Mayo, Allyn Kenner, MD      . amLODipine (NORVASC) tablet 5 mg  5 mg Oral Daily Mayo, Allyn Kenner, MD   5 mg at 05/18/19 0958  . aspirin EC tablet 81 mg  81 mg Oral Daily Mayo, 05/20/19, MD   81 mg at 05/18/19 05/20/19  . brimonidine (ALPHAGAN) 0.15 % ophthalmic solution 1 drop  1 drop Both Eyes BID Mayo, 6237, MD   1 drop at 05/18/19 1001  . haloperidol lactate (HALDOL) injection 2 mg  2 mg Intravenous Q6H PRN 05/20/19, NP   2 mg at 05/18/19 0519  . heparin injection 5,000 Units  5,000 Units Subcutaneous Q8H Mayo, 05/20/19, MD   5,000 Units at 05/18/19 1356  . labetalol (NORMODYNE) injection 10 mg  10 mg Intravenous Q4H PRN Mayo, 05/20/19, MD      . ondansetron [facility]) tablet 4 mg  4 mg Oral Q6H PRN Mayo, [facility], MD       Or  . ondansetron [facility]) injection 4 mg  4 mg Intravenous Q6H PRN Mayo, [facility], MD      .  polyethylene glycol (MIRALAX / GLYCOLAX) packet 17 g  17 g Oral Daily PRN Mayo, Pete Pelt, MD      . propranolol (INDERAL) tablet 60 mg  60 mg Oral Daily Mayo, Pete Pelt, MD   60 mg at 05/18/19 0959  . tamsulosin (FLOMAX) capsule 0.4 mg  0.4 mg Oral Daily Sainani, Belia Heman, MD      . traMADol [facility]at[facility]bon) tablet 50 mg  50 mg Oral Q12H PRN Mayo, Pete Pelt, MD         Discharge Medications: Please see discharge summary for a list of discharge medications.  Relevant Imaging Results:  Relevant Lab Results:   Additional Information ss [id number]  Beverly Sessions, RN

## 2019-05-19 LAB — CBC
HCT: 25.4 % — ABNORMAL LOW (ref 39.0–52.0)
Hemoglobin: 7.8 g/dL — ABNORMAL LOW (ref 13.0–17.0)
MCH: 28.5 pg (ref 26.0–34.0)
MCHC: 30.7 g/dL (ref 30.0–36.0)
MCV: 92.7 fL (ref 80.0–100.0)
Platelets: 91 10*3/uL — ABNORMAL LOW (ref 150–400)
RBC: 2.74 MIL/uL — ABNORMAL LOW (ref 4.22–5.81)
RDW: 16.1 % — ABNORMAL HIGH (ref 11.5–15.5)
WBC: 10.3 10*3/uL (ref 4.0–10.5)
nRBC: 0.2 % (ref 0.0–0.2)

## 2019-05-19 LAB — BASIC METABOLIC PANEL
Anion gap: 8 (ref 5–15)
BUN: 94 mg/dL — ABNORMAL HIGH (ref 8–23)
CO2: 20 mmol/L — ABNORMAL LOW (ref 22–32)
Calcium: 8.5 mg/dL — ABNORMAL LOW (ref 8.9–10.3)
Chloride: 119 mmol/L — ABNORMAL HIGH (ref 98–111)
Creatinine, Ser: 3.5 mg/dL — ABNORMAL HIGH (ref 0.61–1.24)
GFR calc Af Amer: 16 mL/min — ABNORMAL LOW (ref >60)
GFR calc non Af Amer: 14 mL/min — ABNORMAL LOW (ref >60)
Glucose, Bld: 104 mg/dL — ABNORMAL HIGH (ref 70–99)
Potassium: 4.1 mmol/L (ref 3.5–5.1)
Sodium: 147 mmol/L — ABNORMAL HIGH (ref 135–145)

## 2019-05-19 LAB — CK: Total CK: 388 U/L (ref 49–397)

## 2019-05-19 MED ORDER — CHLORHEXIDINE GLUCONATE CLOTH 2 % EX PADS
6.0000 | MEDICATED_PAD | Freq: Every day | CUTANEOUS | Status: DC
  Administered 2019-05-19 – 2019-05-22 (×4): 6 via TOPICAL

## 2019-05-19 NOTE — Progress Notes (Addendum)
   Subjective Foley placed last night with greater than 3 L urine output since then Denies any flank or bladder pain this morning  Physical Exam: BP (!) 166/82 (BP Location: Left Arm)   Pulse (!) 56   Temp 97.8 F (36.6 C) (Oral)   Resp 18   Ht 5\' 11"  (1.803 m)   Wt 72.6 kg   SpO2 100%   BMI 22.32 kg/m    Constitutional:  Alert and oriented, No acute distress. Respiratory: Normal respiratory effort, no increased work of breathing. GI: Abdomen is soft, non-tender, non-distended GU: No CVA tenderness Drains: Urine clear yellow in Foley  Laboratory Data: Labs reviewed, creatinine stable  Assessment & Plan:   In summary, the patient is a frail 85 year old male admitted after a fall and rhabdomyolysis, with likely chronic bladder outlet obstruction over the last year with overflow incontinence, bilateral hydroureteronephrosis, and worsening renal function from his baseline creatinine of 1.9.  Additionally, he has a 5 mm left distal ureteral stone that is asymptomatic.  His right kidney is severely atrophic, however he has made excellent urine output overnight with Foley placement.  I had a long discussion with the son this morning about options for bladder management, as well as the left distal ureteral stone.  With his frailty, lack of symptoms, and excellent urine output, we elected to maintain Foley for the time being with a trial of medical expulsive therapy for his 6 mm stone.   He is likely being moved to a rehab facility in [place] and he will require monthly Foley exchanges for his urinary retention, I would also recommend follow-up imaging to confirm passage of the left ureteral stone.  We discussed the need for cystoscopy, left ureteroscopy, and stent placement if he were to develop decreased urine output and left flank pain, however the family would like to avoid surgery with his age and frailty.  Continue Flomax at discharge Will need follow-up with a urologist locally in  [place] Maintain Foley and will need monthly Foley exchanges Recommend repeat BMP and electrolytes this afternoon in the setting of postobstructive diuresis    Billey Co, MD

## 2019-05-19 NOTE — Care Management Important Message (Signed)
Important Message  Patient Details  Name: Brent Carpenter. MRN: [id number] Date of Birth: June 05, 1924   Medicare Important Message Given:  Yes     Dannette Barbara 05/19/2019, 12:12 PM

## 2019-05-19 NOTE — Progress Notes (Signed)
[facility] [place] [facility]     PATIENT NAME: Brent Carpenter    MR#:  [id number]  DATE OF BIRTH:  1924-03-09  SUBJECTIVE:   Patient here after a fall and noted to have acute rhabdomyolysis and also noted to be in acute kidney injury.  Patient yesterday had a CT abdomen pelvis which was suggestive of hydronephrosis and therefore urology consult obtained.  Urology was considering intervention but patient is urinating well and therefore continue Foley catheter and follow BUN/creatinine for now.  Patient is very hard of hearing, son is at bedside.  Patient's son wants to take his father back to [place] when the patient gets discharged.  REVIEW OF SYSTEMS:    Review of Systems  Unable to perform ROS: Mental acuity    Nutrition: Heart Healthy Tolerating Diet: Yes Tolerating PT: Eval noted.    DRUG ALLERGIES:   Allergies  Allergen Reactions   Other Other (See Comments)    Newspaper - makes him sneeze    VITALS:  Blood pressure (!) 146/57, pulse (!) 50, temperature (!) 97.5 F (36.4 C), temperature source Oral, resp. rate 17, height 5\' 11"  (1.803 m), weight 72.6 kg, SpO2 100 %.  PHYSICAL EXAMINATION:   Physical Exam  GENERAL:  85 y.o.-year-old patient sitting in chair in no acute distress but very hard of hearing.  EYES: Pupils equal, round, reactive to light and accommodation. No scleral icterus. Extraocular muscles intact.  HEENT: Head atraumatic, normocephalic. Oropharynx and nasopharynx clear.  NECK:  Supple, no jugular venous distention. No thyroid enlargement, no tenderness.  LUNGS: Normal breath sounds bilaterally, no wheezing, rales, rhonchi. No use of accessory muscles of respiration.  CARDIOVASCULAR: S1, S2 normal. No murmurs, rubs, or gallops.  ABDOMEN: Soft, nontender, nondistended. Bowel sounds present. No organomegaly or mass.  EXTREMITIES: No cyanosis, clubbing or edema b/l.    NEUROLOGIC: Cranial nerves II through XII are intact. No  focal Motor or sensory deficits b/l.   PSYCHIATRIC: The patient is alert and oriented x 1.  SKIN: No obvious rash, lesion, or ulcer.  Bruising and skin tears on lower ext b/l.     LABORATORY PANEL:   CBC Recent Labs  Lab 05/19/19 0330  WBC 10.3  HGB 7.8*  HCT 25.4*  PLT 91*   ------------------------------------------------------------------------------------------------------------------  Chemistries  Recent Labs  Lab 05/18/19 0420 05/19/19 0330  NA 148* 147*  K 4.3 4.1  CL 114* 119*  CO2 19* 20*  GLUCOSE 94 104*  BUN 93* 94*  CREATININE 3.54* 3.50*  CALCIUM 8.9 8.5*  AST 69*  --   ALT 30  --   ALKPHOS 96  --   BILITOT 1.9*  --    ------------------------------------------------------------------------------------------------------------------  Cardiac Enzymes No results for input(s): TROPONINI in the last 168 hours. ------------------------------------------------------------------------------------------------------------------  RADIOLOGY:  Ct Abdomen Pelvis Wo Contrast  Result Date: 05/18/2019 CLINICAL DATA:  Hydronephrosis. EXAM: CT ABDOMEN AND PELVIS WITHOUT CONTRAST TECHNIQUE: Multidetector CT imaging of the abdomen and pelvis was performed following the standard protocol without IV contrast. COMPARISON:  Ultrasound of same day. FINDINGS: Lower chest: Small right pleural effusion is noted. Hepatobiliary: No focal liver abnormality is seen. No gallstones, gallbladder wall thickening, or biliary dilatation. Pancreas: Unremarkable. No pancreatic ductal dilatation or surrounding inflammatory changes. Spleen: Normal in size without focal abnormality. Adrenals/Urinary Tract: Adrenal glands appear normal. Right renal atrophy is noted with mild right hydroureteronephrosis without obstructing calculus. Moderate left hydroureteronephrosis is noted with nonobstructing 6 mm calculus seen in the distal left ureter.  No definite evidence of obstructive calculus is noted. Moderate  urinary bladder distention is noted concerning for bladder outlet obstruction. Stomach/Bowel: Stomach is within normal limits. Appendix appears normal. No evidence of bowel wall thickening, distention, or inflammatory changes. Vascular/Lymphatic: Atherosclerosis of abdominal aorta is noted. 2.9 cm left common iliac artery aneurysm is noted. No adenopathy is noted. Reproductive: Mild prostatic enlargement is noted. Other: Midline surgical staples are noted anteriorly. Musculoskeletal: Severe degenerative changes seen involving the right hip. No acute abnormality is noted. IMPRESSION: Moderate left hydroureteronephrosis is noted with nonobstructing 6 mm distal left ureteral calculus, but no evidence of obstructing ureteral calculus. Also noted is mild right hydroureteronephrosis without obstructing calculus. Moderate urinary bladder distention is noted concerning for bladder outlet obstruction. Mild prostatic enlargement is noted. 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 Small right pleural effusion. Aortic Atherosclerosis (ICD10-I70.0). Electronically Signed   By: Lupita Raider M.D.   On: 05/18/2019 12:50   Dg Chest 1 View  Result Date: 05/17/2019 CLINICAL DATA:  Patient was found down. The patient fell yesterday. EXAM: CHEST  1 VIEW COMPARISON:  03/16/2011 FINDINGS: The heart size is normal. Slight pulmonary vascular prominence. Tortuosity and calcification of the thoracic aorta. No discrete infiltrates or effusions. No acute bone abnormality. IMPRESSION: 1. Slight pulmonary vascular prominence. 2. Aortic atherosclerosis. Electronically Signed   By: Francene Boyers M.D.   On: 05/17/2019 17:01   Dg Abd 1 View  Result Date: 05/18/2019 CLINICAL DATA:  Possible left-sided kidney stone. History of smoking and aneurysm. EXAM: ABDOMEN - 1 VIEW COMPARISON:  05/18/2019, CT of the abdomen pelvis. FINDINGS: Renal shadows are mostly obscured by overlying bowel gas and stool. No convincing renal stone. No evidence of a  ureteral stone. No bowel dilation to suggest obstruction. Surgical vascular clips project in the mid abdomen. There are sutures along the anterior abdominal wall midline. Dense aortoiliac artery vascular calcifications. Soft tissues otherwise unremarkable. No acute skeletal abnormality. There are advanced arthropathic changes of the right hip. IMPRESSION: 1. No acute findings. No radiographic evidence of a renal or ureteral stone. Electronically Signed   By: Amie Portland M.D.   On: 05/18/2019 17:48   Ct Head Wo Contrast  Result Date: 05/17/2019 CLINICAL DATA:  Fall EXAM: CT HEAD WITHOUT CONTRAST CT CERVICAL SPINE WITHOUT CONTRAST TECHNIQUE: Multidetector CT imaging of the head and cervical spine was performed following the standard protocol without intravenous contrast. Multiplanar CT image reconstructions of the cervical spine were also generated. COMPARISON:  MR brain, 11/27/2008 FINDINGS: CT HEAD FINDINGS Brain: No evidence of acute infarction, hemorrhage, hydrocephalus, extra-axial collection or mass lesion/mass effect. Periventricular and deep Barcellos matter hypodensity, global volume loss, and enlargement of the lateral ventricles ex vacuo. Nonacute encephalomalacia of the left parietal lobe (series 2, image 24). Vascular: No hyperdense vessel or unexpected calcification. 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 Skull: Normal. Negative for fracture or focal lesion. Sinuses/Orbits: No acute finding. Other: None. CT CERVICAL SPINE FINDINGS Alignment: Degenerative straightening of the normal cervical lordosis. Skull base and vertebrae: No acute fracture. No primary bone lesion or focal pathologic process. Soft tissues and spinal canal: No prevertebral fluid or swelling. No visible canal  hematoma. Disc levels: Severe disc degenerative disease and osteophytosis of the lower cervical spine. Upper chest: Partially  imaged right pleural effusion. Other: None. IMPRESSION: 1.  No acute intracranial pathology. 2. Small-vessel Mossberg matter disease, global volume loss, enlargement of the lateral ventricles ex vacuo, progressed in comparison to examination dated 11/27/2008. 3.  Nonacute encephalomalacia of the left parietal lobe. 4. 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 5.  No fracture or static subluxation of the cervical spine. 6. Severe disc degenerative disease and osteophytosis of the lower cervical spine. 7. Partially imaged right pleural effusion. Electronically Signed   By: Lauralyn Primes M.D.   On: 05/17/2019 15:19   Ct Cervical Spine Wo Contrast  Result Date: 05/17/2019 CLINICAL DATA:  Fall EXAM: CT HEAD WITHOUT CONTRAST CT CERVICAL SPINE WITHOUT CONTRAST TECHNIQUE: Multidetector CT imaging of the head and cervical spine was performed following the standard protocol without intravenous contrast. Multiplanar CT image reconstructions of the cervical spine were also generated. COMPARISON:  MR brain, 11/27/2008 FINDINGS: CT HEAD FINDINGS Brain: No evidence of acute infarction, hemorrhage, hydrocephalus, extra-axial collection or mass lesion/mass effect. Periventricular and deep Renken matter hypodensity, global volume loss, and enlargement of the lateral ventricles ex vacuo. Nonacute encephalomalacia of the left parietal lobe (series 2, image 24). Vascular: No hyperdense vessel or unexpected calcification. 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 Skull: Normal. Negative for fracture or focal lesion. Sinuses/Orbits: No acute finding. Other: None. CT CERVICAL SPINE FINDINGS Alignment: Degenerative straightening of the normal cervical lordosis. Skull base and vertebrae: No acute fracture. No primary bone lesion or focal pathologic process. Soft tissues and spinal canal: No prevertebral fluid or swelling. No visible canal hematoma. Disc levels: Severe disc degenerative disease and osteophytosis of the lower cervical spine. Upper chest: Partially imaged right pleural effusion. Other: None. IMPRESSION: 1.  No acute intracranial pathology. 2. Small-vessel Hannen matter disease, global volume loss, enlargement of the lateral ventricles ex vacuo, progressed in comparison to examination dated 11/27/2008. 3.  Nonacute encephalomalacia of the left parietal lobe. 4. 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 5.  No fracture or static subluxation of the cervical spine. 6. Severe disc degenerative disease and osteophytosis of the lower cervical spine. 7. Partially imaged right pleural effusion. Electronically Signed   By: Lauralyn Primes M.D.   On: 05/17/2019 15:19   US Renal  Result Date: 05/18/2019 CLINICAL DATA:  Acute renal failure. EXAM: RENAL / URINARY TRACT ULTRASOUND COMPLETE COMPARISON:  Renal ultrasound 10/10/2018. FINDINGS: Right Kidney: Renal measurements: 8.6 x 5.0 x 5.0 cm = volume: 111 mL. The cortex is thinned with increased echogenicity. Mild hydronephrosis is noted. 1.3 cm cyst off the upper pole noted. Left Kidney: Renal measurements: 10.8 x 7.3 x 5.9 cm = volume: 243.5 mL. Moderate to moderately severe hydronephrosis appears worse than on the prior exam.  Cortical echogenicity is increased. Bladder: Distended. Debris is seen in the urinary bladder. Bilateral  ureteral jets are visualized. IMPRESSION: Mild right hydronephrosis is new since the prior examination. Moderate to moderately severe left hydronephrosis appears worse than on the prior study. Bilateral ureteral jets are seen. Cortical echogenicity of the kidneys is increased bilaterally compatible with medical renal disease. Distended urinary bladder with debris layering dependently. Electronically Signed   By: Inge Rise M.D.   On: 05/18/2019 10:32     ASSESSMENT AND PLAN:   85 yo male w/ hx of Glaucoma, Tremor, very hard of hearing who presented to the hospital after a fall and noted to be found on the floor for prolonged period of time.   ** Acute renal failure in CKD III- likely due to rhabdomyolysis and dehydration. Creatinine 3.48. Baseline creatinine 1.6-1.9. -Creatinine still elevated at 3.5 today.  Abdominal ultrasound and CT abdomen pelvis was suggestive of moderate to severe left-sided hydronephrosis with nonobstructive nephrolithiasis. -Urology consult obtained and discussed with the patient's son about possible intervention.  Patient is urinating well and therefore hold off on intervention.  Continue IV fluids, maintain Foley, continue Flomax, follow BUN and creatinine. -Patient has had 1.6 L of urine output over the past 24 hours. -Appreciate nephrology and urology input, avoid Nephrotoxins.    ** Mild rhabdomyolysis - after being found down on the ground.  - CK's has trended down and normalized.   ** History of falls- patient has multiple bruises on his hands and arms.  States this fall occurred due to tripping on the carpet. -CT head and C-spine were unremarkable for acute abnormalities - Pt eval noted and pt. Will need SNF/STR upon discharge.   ** Elevated troponin- likely due to demand ischemia. No active chest pain.  Troponin 131 > 111. -Continue home aspirin 81  mg daily  ** Hypernatremia- due to hydration - resolved with IV fluids.   ** Leukocytosis- may be reactive.  No signs of infection. - UA & CXR (-)   ** Elevated AST-due to rhabdomyolysis and improving.   ** Normocytic anemia- no signs of bleeding. - anemia panel consistent with anemia of chronic disease.  - no acute need for transfusion.   ** Hypertension- cont. Norvasc. BP stable.   ** Tremor- chronic issue for him -Continue home propranolol 60 mg daily  Discussed plan of care with urology, nephrology and also patient's son at bedside.  Patient's son wants to take his father back to [place].  Patient has received approval by insurance for rehab facility in [place].  Await creatinine to trend down a bit more prior to discharge.  Family will transport patient to [place].   All the records are reviewed and case discussed with Care Management/Social Worker. Management plans discussed with the patient, family and they are in agreement.  CODE STATUS: DNR  DVT Prophylaxis: Hep. SQ  TOTAL TIME TAKING CARE OF THIS PATIENT: 35 minutes.   POSSIBLE D/C IN 2-3 DAYS, DEPENDING ON CLINICAL CONDITION.   Henreitta Leber M.D on 05/19/2019 at 2:05 PM  Between 7am to 6pm - Pager - [phone]  After 6pm go to [email or web address] - Proofreader  [facility]  Office  [phone]  CC: Primary care physician; [place] Pink, MD

## 2019-05-19 NOTE — Progress Notes (Signed)
[facility]  ROUNDING NOTE   Subjective:  Patient seen at bedside. Urine output was 1.6 L over the preceding 24 hours. Foley catheter in place.   Objective:  Vital signs in last 24 hours:  Temp:  [97.1 F (36.2 C)-97.8 F (36.6 C)] 97.8 F (36.6 C) (09/25 0800) Pulse Rate:  [56-100] 56 (09/25 0805) Resp:  [16-18] 18 (09/25 0800) BP: (123-166)/(71-112) 166/82 (09/25 0805) SpO2:  [76 %-100 %] 100 % (09/25 0800) FiO2 (%):  [28 %] 28 % (09/24 1631)  Weight change:  Filed Weights   05/17/19 1359  Weight: 72.6 kg    Intake/Output: I/O last 3 completed shifts: In: 1483.5 [P.O.:240; I.V.:1243.5] Out: 1625 [Urine:1625]   Intake/Output this shift:  Total I/O In: -  Out: 1075 [Urine:1075]  Physical Exam: General: No acute distress  Head: Normocephalic, atraumatic. Moist oral mucosal membranes  Eyes: Anicteric  Neck: Supple, trachea midline  Lungs:  Clear to auscultation, normal effort  Heart: S1S2 no rubs  Abdomen:  Soft, nontender, bowel sounds present  Extremities: No peripheral edema.  Neurologic: Awake, very hard of hearing  Skin: No lesions       Basic Metabolic Panel: Recent Labs  Lab 05/17/19 1406 05/18/19 0420 05/19/19 0330  NA 147* 148* 147*  K 4.7 4.3 4.1  CL 112* 114* 119*  CO2 18* 19* 20*  GLUCOSE 75 94 104*  BUN 83* 93* 94*  CREATININE 3.48* 3.54* 3.50*  CALCIUM 9.5 8.9 8.5*    Liver Function Tests: Recent Labs  Lab 05/17/19 1406 05/18/19 0420  AST 83* 69*  ALT 28 30  ALKPHOS 103 96  BILITOT 2.9* 1.9*  PROT 7.0 6.1*  ALBUMIN 3.6 3.3*   No results for input(s): LIPASE, AMYLASE in the last 168 hours. No results for input(s): AMMONIA in the last 168 hours.  CBC: Recent Labs  Lab 05/17/19 1406 05/18/19 0420 05/19/19 0330  WBC 15.7* 15.5* 10.3  NEUTROABS 13.4*  --   --   HGB 9.7* 8.9* 7.8*  HCT 30.5* 27.9* 25.4*  MCV 90.8 90.9 92.7  PLT 98* 102* 91*    Cardiac Enzymes: Recent Labs  Lab 05/17/19 1406  05/18/19 0420 05/19/19 0330  CKTOTAL 2,305* 1,012* 388    BNP: Invalid input(s): POCBNP  CBG: No results for input(s): GLUCAP in the last 168 hours.  Microbiology: Results for orders placed or performed during the hospital encounter of 05/17/19  SARS Coronavirus 2 [facility] order, Performed in [facility] hospital lab) Nasopharyngeal Nasopharyngeal Swab     Status: None   Collection Time: 05/17/19  3:17 PM   Specimen: Nasopharyngeal Swab  Result Value Ref Range Status   SARS Coronavirus 2 NEGATIVE NEGATIVE Final    Comment: (NOTE) If result is NEGATIVE SARS-CoV-2 target nucleic acids are NOT DETECTED. The SARS-CoV-2 RNA is generally detectable in upper and lower  respiratory specimens during the acute phase of infection. The lowest  concentration of SARS-CoV-2 viral copies this assay can detect is 250  copies / mL. A negative result does not preclude SARS-CoV-2 infection  and should not be used as the sole basis for treatment or other  patient management decisions.  A negative result may occur with  improper specimen collection / handling, submission of specimen other  than nasopharyngeal swab, presence of viral mutation(s) within the  areas targeted by this assay, and inadequate number of viral copies  (<250 copies / mL). A negative result must be combined with clinical  observations, patient history, and epidemiological information.  If result is POSITIVE SARS-CoV-2 target nucleic acids are DETECTED. The SARS-CoV-2 RNA is generally detectable in upper and lower  respiratory specimens dur ing the acute phase of infection.  Positive  results are indicative of active infection with SARS-CoV-2.  Clinical  correlation with patient history and other diagnostic information is  necessary to determine patient infection status.  Positive results do  not rule out bacterial infection or co-infection with other viruses. If result is PRESUMPTIVE POSTIVE SARS-CoV-2 nucleic acids MAY BE  PRESENT.   A presumptive positive result was obtained on the submitted specimen  and confirmed on repeat testing.  While 2019 novel coronavirus  (SARS-CoV-2) nucleic acids may be present in the submitted sample  additional confirmatory testing may be necessary for epidemiological  and / or clinical management purposes  to differentiate between  SARS-CoV-2 and other Sarbecovirus currently known to infect humans.  If clinically indicated additional testing with an alternate test  methodology [phone]) is advised. The SARS-CoV-2 RNA is generally  detectable in upper and lower respiratory sp ecimens during the acute  phase of infection. The expected result is Negative. Fact Sheet for Patients:  StrictlyIdeas.no Fact Sheet for Healthcare Providers: BankingDealers.co.za This test is not yet approved or cleared by the [place] FDA and has been authorized for detection and/or diagnosis of SARS-CoV-2 by FDA under an Emergency Use Authorization (EUA).  This EUA will remain in effect (meaning this test can be used) for the duration of the COVID-19 declaration under Section 564(b)(1) of the Act, 21 U.S.C. section 360bbb-3(b)(1), unless the authorization is terminated or revoked sooner. Performed at [facility], [address] [place],  [address]     Coagulation Studies: No results for input(s): LABPROT, INR in the last 72 hours.  Urinalysis: Recent Labs    05/17/19 1835  COLORURINE YELLOW*  LABSPEC 1.011  PHURINE 5.0  GLUCOSEU NEGATIVE  HGBUR MODERATE*  BILIRUBINUR NEGATIVE  KETONESUR NEGATIVE  PROTEINUR NEGATIVE  NITRITE NEGATIVE  LEUKOCYTESUR NEGATIVE      Imaging: Ct Abdomen Pelvis Wo Contrast  Result Date: 05/18/2019 CLINICAL DATA:  Hydronephrosis. EXAM: CT ABDOMEN AND PELVIS WITHOUT CONTRAST TECHNIQUE: Multidetector CT imaging of the abdomen and pelvis was performed following the standard protocol  without IV contrast. COMPARISON:  Ultrasound of same day. FINDINGS: Lower chest: Small right pleural effusion is noted. Hepatobiliary: No focal liver abnormality is seen. No gallstones, gallbladder wall thickening, or biliary dilatation. Pancreas: Unremarkable. No pancreatic ductal dilatation or surrounding inflammatory changes. Spleen: Normal in size without focal abnormality. Adrenals/Urinary Tract: Adrenal glands appear normal. Right renal atrophy is noted with mild right hydroureteronephrosis without obstructing calculus. Moderate left hydroureteronephrosis is noted with nonobstructing 6 mm calculus seen in the distal left ureter. No definite evidence of obstructive calculus is noted. Moderate urinary bladder distention is noted concerning for bladder outlet obstruction. Stomach/Bowel: Stomach is within normal limits. Appendix appears normal. No evidence of bowel wall thickening, distention, or inflammatory changes. Vascular/Lymphatic: Atherosclerosis of abdominal aorta is noted. 2.9 cm left common iliac artery aneurysm is noted. No adenopathy is noted. Reproductive: Mild prostatic enlargement is noted. Other: Midline surgical staples are noted anteriorly. Musculoskeletal: Severe degenerative changes seen involving the right hip. No acute abnormality is noted. IMPRESSION: Moderate left hydroureteronephrosis is noted with nonobstructing 6 mm distal left ureteral calculus, but no evidence of obstructing ureteral calculus. Also noted is mild right hydroureteronephrosis without obstructing calculus. Moderate urinary bladder distention is noted concerning for bladder outlet obstruction. Mild prostatic enlargement is noted. 2.9 cm  left common iliac artery aneurysm. Small right pleural effusion. Aortic Atherosclerosis (ICD10-I70.0). Electronically Signed   By: Lupita Raider M.D.   On: 05/18/2019 12:50   Dg Chest 1 View  Result Date: 05/17/2019 CLINICAL DATA:  Patient was found down. The patient fell yesterday.  EXAM: CHEST  1 VIEW COMPARISON:  03/16/2011 FINDINGS: The heart size is normal. Slight pulmonary vascular prominence. Tortuosity and calcification of the thoracic aorta. No discrete infiltrates or effusions. No acute bone abnormality. IMPRESSION: 1. Slight pulmonary vascular prominence. 2. Aortic atherosclerosis. Electronically Signed   By: Francene Boyers M.D.   On: 05/17/2019 17:01   Dg Abd 1 View  Result Date: 05/18/2019 CLINICAL DATA:  Possible left-sided kidney stone. History of smoking and aneurysm. EXAM: ABDOMEN - 1 VIEW COMPARISON:  05/18/2019, CT of the abdomen pelvis. FINDINGS: Renal shadows are mostly obscured by overlying bowel gas and stool. No convincing renal stone. No evidence of a ureteral stone. No bowel dilation to suggest obstruction. Surgical vascular clips project in the mid abdomen. There are sutures along the anterior abdominal wall midline. Dense aortoiliac artery vascular calcifications. Soft tissues otherwise unremarkable. No acute skeletal abnormality. There are advanced arthropathic changes of the right hip. IMPRESSION: 1. No acute findings. No radiographic evidence of a renal or ureteral stone. Electronically Signed   By: Amie Portland M.D.   On: 05/18/2019 17:48   Ct Head Wo Contrast  Result Date: 05/17/2019 CLINICAL DATA:  Fall EXAM: CT HEAD WITHOUT CONTRAST CT CERVICAL SPINE WITHOUT CONTRAST TECHNIQUE: Multidetector CT imaging of the head and cervical spine was performed following the standard protocol without intravenous contrast. Multiplanar CT image reconstructions of the cervical spine were also generated. COMPARISON:  MR brain, 11/27/2008 FINDINGS: CT HEAD FINDINGS Brain: No evidence of acute infarction, hemorrhage, hydrocephalus, extra-axial collection or mass lesion/mass effect. Periventricular and deep Najarro matter hypodensity, global volume loss, and enlargement of the lateral ventricles ex vacuo. Nonacute encephalomalacia of the left parietal lobe (series 2, image  24). Vascular: No hyperdense vessel or unexpected calcification. 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 Skull: Normal. Negative for fracture or focal lesion. Sinuses/Orbits: No acute finding. Other: None. CT CERVICAL SPINE FINDINGS Alignment: Degenerative straightening of the normal cervical lordosis. Skull base and vertebrae: No acute fracture. No primary bone lesion or focal pathologic process. Soft tissues and spinal canal: No prevertebral fluid or swelling. No visible canal hematoma. Disc levels: Severe disc degenerative disease and osteophytosis of the lower cervical spine. Upper chest: Partially imaged right pleural effusion. Other: None. IMPRESSION: 1.  No acute intracranial pathology. 2. Small-vessel Zulauf matter disease, global volume loss, enlargement of the lateral ventricles ex vacuo, progressed in comparison to examination dated 11/27/2008. 3.  Nonacute encephalomalacia of the left parietal lobe. 4. 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 5.  No fracture or static subluxation of the cervical spine. 6. Severe disc degenerative disease and osteophytosis of the lower cervical spine. 7. Partially imaged right pleural effusion. Electronically Signed   By: Lauralyn Primes M.D.   On: 05/17/2019 15:19  Ct Cervical Spine Wo Contrast  Result Date: 05/17/2019 CLINICAL DATA:  Fall EXAM: CT HEAD WITHOUT CONTRAST CT CERVICAL SPINE WITHOUT CONTRAST TECHNIQUE: Multidetector CT imaging of the head and cervical spine was performed following the standard protocol without intravenous  contrast. Multiplanar CT image reconstructions of the cervical spine were also generated. COMPARISON:  MR brain, 11/27/2008 FINDINGS: CT HEAD FINDINGS Brain: No evidence of acute infarction, hemorrhage, hydrocephalus, extra-axial collection or mass lesion/mass effect. Periventricular and deep [place] matter hypodensity, global volume loss, and enlargement of the lateral ventricles ex vacuo. Nonacute encephalomalacia of the left parietal lobe (series 2, image 24). Vascular: No hyperdense vessel or unexpected calcification. 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 Skull: Normal. Negative for fracture or focal lesion. Sinuses/Orbits: No acute finding. Other: None. CT CERVICAL SPINE FINDINGS Alignment: Degenerative straightening of the normal cervical lordosis. Skull base and vertebrae: No acute fracture. No primary bone lesion or focal pathologic process. Soft tissues and spinal canal: No prevertebral fluid or swelling. No visible canal hematoma. Disc levels: Severe disc degenerative disease and osteophytosis of the lower cervical spine. Upper chest: Partially imaged right pleural effusion. Other: None. IMPRESSION: 1.  No acute intracranial pathology. 2. Small-vessel Monterroso matter disease, global volume loss, enlargement of the lateral ventricles ex vacuo, progressed in comparison to examination dated 11/27/2008. 3.  Nonacute encephalomalacia of the left parietal lobe. 4. 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 5.  No fracture or static subluxation of the cervical spine. 6. Severe disc  degenerative disease and osteophytosis of the lower cervical spine. 7. Partially imaged right pleural effusion. Electronically Signed   By: Lauralyn Primes M.D.   On: 05/17/2019 15:19   US Renal  Result Date: 05/18/2019 CLINICAL DATA:  Acute renal failure. EXAM: RENAL / URINARY TRACT ULTRASOUND COMPLETE COMPARISON:  Renal ultrasound 10/10/2018. FINDINGS: Right Kidney: Renal measurements: 8.6 x 5.0 x 5.0 cm = volume: 111 mL. The cortex is thinned with increased echogenicity. Mild hydronephrosis is noted. 1.3 cm cyst off the upper pole noted. Left Kidney: Renal measurements: 10.8 x 7.3 x 5.9 cm = volume: 243.5 mL. Moderate to moderately severe hydronephrosis appears worse than on the prior exam. Cortical echogenicity is increased. Bladder: Distended. Debris is seen in the urinary bladder. Bilateral ureteral jets are visualized. IMPRESSION: Mild right hydronephrosis is new since the prior examination. Moderate to moderately severe left hydronephrosis appears worse than on the prior study. Bilateral ureteral jets are seen. Cortical echogenicity of the kidneys is increased bilaterally compatible with medical renal disease. Distended urinary bladder with debris layering dependently. Electronically Signed   By: Drusilla Kanner M.D.   On: 05/18/2019 10:32     Medications:   . sodium chloride 100 mL/hr at 05/19/19 0242   . amLODipine  5 mg Oral Daily  . aspirin EC  81 mg Oral Daily  . brimonidine  1 drop Both Eyes BID  . Chlorhexidine Gluconate Cloth  6 each Topical Daily  . heparin  5,000 Units Subcutaneous Q8H  . propranolol  60 mg Oral Daily  .  tamsulosin  0.4 mg Oral Daily   acetaminophen **OR** acetaminophen, haloperidol lactate, labetalol, ondansetron **OR** ondansetron (ZOFRAN) IV, polyethylene glycol, traMADol  Assessment/ Plan:  85 y.o. male with past medical history of hypertension, chronic kidney disease stage IV baseline creatinine 2.1, hard of hearing status who presents now with fall and  acute renal failure.  1.  Acute renal failure in part secondary to hydronephrosis. 2.  Chronic kidney disease stage IV baseline creatinine 2.1. 3.  Bilateral hydronephrosis left greater than right. 4.  Anemia of chronic kidney disease hemoglobin 7.8  Plan: Foley catheter has been placed.  Urine output was 1.6 L over the preceding 24 hours.  BUN remains high at 94 with a creatinine of 3.50.  Continue IV fluid hydration with 0.9 normal saline for now.  Urology to have further discussion with the patient son regarding surgical options.  No indication for dialysis at this time.  We will continue to monitor along with you.   LOS: 2 Rakisha Pincock 9/25/202010:43 AM

## 2019-05-19 NOTE — TOC Initial Note (Addendum)
Transition of Care (TOC) - Initial/Assessment Note    Patient Details  Name: Brent Carpenter. MRN: [id number] Date of Birth: 1923/11/18  Transition of Care [facility]) CM/SW Contact:    Brent Sessions, RN Phone Number: 05/19/2019, 3:37 PM  Clinical Narrative:                 Patient admitted from home for ARF, after sustaining a fall   Patient lives at home alone.  Patient has PCS services 3 days a week.  Patient has 2 sons, they both live in [place].   PCS worker helps take patient to appointments.  Sons order groceries from [facility] [employer] and has it delivered to patients home.   PCP Brent Carpenter  PT has assessed patient recommends SNF.  Patient agreeable. Family has decided to purse SNF in [place], and also in agreement to start local bed search  Per family request RNCM reached out to the [facility] in [place].  Spoke with Admission coordinator Brent Carpenter.  [phone].  She has obtained insurance authorization.  Patient will be [facility] to the "[facility]".  If patient discharges over weekend please call the number above and speak with the nurse named Brent Carpenter    Discharge summary and AVS needs to be faxed to [phone]  Family to transport  Expected Discharge Plan: Brent Carpenter     Patient Goals and CMS Choice        Expected Discharge Plan and Services Expected Discharge Plan: Brent Carpenter       Living arrangements for the past 2 months: Single Family Home                                      Prior Living Arrangements/Services Living arrangements for the past 2 months: Single Family Home Lives with:: Self                   Activities of Daily Living Home Assistive Devices/Equipment: Environmental consultant (specify type) ADL Screening (condition at time of admission) Patient's cognitive ability adequate to safely complete daily activities?: Yes Is the patient deaf or have difficulty hearing?: Yes Does the patient have difficulty seeing, even when wearing  glasses/contacts?: No Does the patient have difficulty concentrating, remembering, or making decisions?: No Patient able to express need for assistance with ADLs?: Yes Does the patient have difficulty dressing or bathing?: No Independently performs ADLs?: Yes (appropriate for developmental age) Does the patient have difficulty walking or climbing stairs?: Yes Weakness of Legs: Both Weakness of Arms/Hands: None  Permission Sought/Granted                  Emotional Assessment       Orientation: : Oriented to Self, Oriented to Place      Admission diagnosis:  Pleural effusion [J90] Elevated troponin [R79.89] Elevated CK [R74.8] AKI (acute kidney injury) ([place]) [N17.9] Fall, initial encounter [W19.XXXA] Patient Active Problem List   Diagnosis Date Noted  . Acute renal failure (ARF) ([place]) 05/17/2019   PCP:  Brent Pink, MD Pharmacy:   [id number] [place], [place] [address] Phone: [phone] Fax: [phone]     Social Determinants of Health (SDOH) Interventions    Readmission Risk Interventions No flowsheet data found.

## 2019-05-20 LAB — CBC
HCT: 24.2 % — ABNORMAL LOW (ref 39.0–52.0)
Hemoglobin: 7.6 g/dL — ABNORMAL LOW (ref 13.0–17.0)
MCH: 28.9 pg (ref 26.0–34.0)
MCHC: 31.4 g/dL (ref 30.0–36.0)
MCV: 92 fL (ref 80.0–100.0)
Platelets: 68 10*3/uL — ABNORMAL LOW (ref 150–400)
RBC: 2.63 MIL/uL — ABNORMAL LOW (ref 4.22–5.81)
RDW: 16.1 % — ABNORMAL HIGH (ref 11.5–15.5)
WBC: 6.6 10*3/uL (ref 4.0–10.5)
nRBC: 0 % (ref 0.0–0.2)

## 2019-05-20 LAB — COMPREHENSIVE METABOLIC PANEL
ALT: 30 U/L (ref 0–44)
AST: 28 U/L (ref 15–41)
Albumin: 2.9 g/dL — ABNORMAL LOW (ref 3.5–5.0)
Alkaline Phosphatase: 75 U/L (ref 38–126)
Anion gap: 10 (ref 5–15)
BUN: 79 mg/dL — ABNORMAL HIGH (ref 8–23)
CO2: 17 mmol/L — ABNORMAL LOW (ref 22–32)
Calcium: 8.2 mg/dL — ABNORMAL LOW (ref 8.9–10.3)
Chloride: 118 mmol/L — ABNORMAL HIGH (ref 98–111)
Creatinine, Ser: 2.97 mg/dL — ABNORMAL HIGH (ref 0.61–1.24)
GFR calc Af Amer: 20 mL/min — ABNORMAL LOW (ref >60)
GFR calc non Af Amer: 17 mL/min — ABNORMAL LOW (ref >60)
Glucose, Bld: 91 mg/dL (ref 70–99)
Potassium: 3.8 mmol/L (ref 3.5–5.1)
Sodium: 145 mmol/L (ref 135–145)
Total Bilirubin: 1.3 mg/dL — ABNORMAL HIGH (ref 0.3–1.2)
Total Protein: 5.5 g/dL — ABNORMAL LOW (ref 6.5–8.1)

## 2019-05-20 MED ORDER — SODIUM BICARBONATE 650 MG PO TABS
650.0000 mg | ORAL_TABLET | Freq: Two times a day (BID) | ORAL | Status: DC
  Administered 2019-05-20 – 2019-05-22 (×5): 650 mg via ORAL
  Filled 2019-05-20 (×5): qty 1

## 2019-05-20 MED ORDER — QUETIAPINE FUMARATE 25 MG PO TABS
12.5000 mg | ORAL_TABLET | Freq: Every day | ORAL | Status: DC
  Administered 2019-05-20: 12.5 mg via ORAL
  Filled 2019-05-20: qty 1

## 2019-05-20 NOTE — Progress Notes (Addendum)
Brent Carpenter.  MRN: [id number]  DOB/AGE: 1924-05-12 85 y.o.  Primary Care Physician:Hedrick, Jeneen Rinks, MD  Admit date: 05/17/2019  Chief Complaint:  Chief Complaint  Patient presents with  . Fall    S-Pt presented on  05/17/2019 with  Chief Complaint  Patient presents with  . Fall  .    Pt is sitting comfortably in no distress.    Pt offers no specific complaints  Meds . amLODipine  5 mg Oral Daily  . aspirin EC  81 mg Oral Daily  . brimonidine  1 drop Both Eyes BID  . Chlorhexidine Gluconate Cloth  6 each Topical Daily  . heparin  5,000 Units Subcutaneous Q8H  . propranolol  60 mg Oral Daily  . tamsulosin  0.4 mg Oral Daily       Physical Exam: Vital signs in last 24 hours: Temp:  [97.3 F (36.3 C)-97.5 F (36.4 C)] 97.4 F (36.3 C) (09/26 0426) Pulse Rate:  [50-55] 55 (09/26 0426) Resp:  [17-19] 19 (09/26 0426) BP: (146-176)/(54-66) 176/66 (09/26 0426) SpO2:  [93 %-100 %] 99 % (09/26 0426) Weight change:  Last BM Date: 05/19/19  Intake/Output from previous day: 09/25 0701 - 09/26 0700 In: 2014.8 [P.O.:240; I.V.:1774.8] Out: 2875 [Urine:2875] No intake/output data recorded.   Physical Exam: General- pt is awake,alert, oriented to time place and person Resp- No acute REsp distress, CTA B/L NO Rhonchi CVS- S1S2 regular ij rate and rhythm GIT- BS+, soft, NT, ND EXT- NO LE Edema, Cyanosis   Lab Results: CBC Recent Labs    05/19/19 0330 05/20/19 0611  WBC 10.3 6.6  HGB 7.8* 7.6*  HCT 25.4* 24.2*  PLT 91* 68*    BMET Recent Labs    05/19/19 0330 05/20/19 0611  NA 147* 145  K 4.1 3.8  CL 119* 118*  CO2 20* 17*  GLUCOSE 104* 91  BUN 94* 79*  CREATININE 3.50* 2.97*  CALCIUM 8.5* 8.2*    Anion gap 145-135=10 Alb 2.9  Delta AG 10-8=2  Delta Bicarb  24-17=7  Creat 2020  3.5=>2.97  MICRO Recent Results (from the past 240 hour(s))  SARS Coronavirus 2 [facility] order, Performed in [facility] hospital lab) Nasopharyngeal  Nasopharyngeal Swab     Status: None   Collection Time: 05/17/19  3:17 PM   Specimen: Nasopharyngeal Swab  Result Value Ref Range Status   SARS Coronavirus 2 NEGATIVE NEGATIVE Final    Comment: (NOTE) If result is NEGATIVE SARS-CoV-2 target nucleic acids are NOT DETECTED. The SARS-CoV-2 RNA is generally detectable in upper and lower  respiratory specimens during the acute phase of infection. The lowest  concentration of SARS-CoV-2 viral copies this assay can detect is 250  copies / mL. A negative result does not preclude SARS-CoV-2 infection  and should not be used as the sole basis for treatment or other  patient management decisions.  A negative result may occur with  improper specimen collection / handling, submission of specimen other  than nasopharyngeal swab, presence of viral mutation(s) within the  areas targeted by this assay, and inadequate number of viral copies  (<250 copies / mL). A negative result must be combined with clinical  observations, patient history, and epidemiological information. If result is POSITIVE SARS-CoV-2 target nucleic acids are DETECTED. The SARS-CoV-2 RNA is generally detectable in upper and lower  respiratory specimens dur ing the acute phase of infection.  Positive  results are indicative of active infection with SARS-CoV-2.  Clinical  correlation with patient history  and other diagnostic information is  necessary to determine patient infection status.  Positive results do  not rule out bacterial infection or co-infection with other viruses. If result is PRESUMPTIVE POSTIVE SARS-CoV-2 nucleic acids MAY BE PRESENT.   A presumptive positive result was obtained on the submitted specimen  and confirmed on repeat testing.  While 2019 novel coronavirus  (SARS-CoV-2) nucleic acids may be present in the submitted sample  additional confirmatory testing may be necessary for epidemiological  and / or clinical management purposes  to differentiate between   SARS-CoV-2 and other Sarbecovirus currently known to infect humans.  If clinically indicated additional testing with an alternate test  methodology [phone]) is advised. The SARS-CoV-2 RNA is generally  detectable in upper and lower respiratory sp ecimens during the acute  phase of infection. The expected result is Negative. Fact Sheet for Patients:  BoilerBrush.com.cy Fact Sheet for Healthcare Providers: https://pope.com/ This test is not yet approved or cleared by the [place] FDA and has been authorized for detection and/or diagnosis of SARS-CoV-2 by FDA under an Emergency Use Authorization (EUA).  This EUA will remain in effect (meaning this test can be used) for the duration of the COVID-19 declaration under Section 564(b)(1) of the Act, 21 U.S.C. section 360bbb-3(b)(1), unless the authorization is terminated or revoked sooner. Performed at [facility], [address], [place], [place] [address]       Lab Results  Component Value Date   CALCIUM 8.2 (L) 05/20/2019   Albumin  2.9         Impression:  Pt is a 27 year ol;d caucasian male with past medical hx of Hydronephrosis( in Feb 2020) who was brought to ER with c/o  Fall and upon admission pt was found to have AKI, Anemia,Hypernatremia and hydronephrosis    1)Renal  AKI secondary to Post renal                AKI on CKD               CKD stage 4.               CKD since               CKD secondary to POst renal/Age asso decline                Progression of CKD marked with AKI                AKI now better/ at plateau  2)HTN  BP stable  Medication- On Calcium Channel Blockers On Beta blockers    3)Anemia-Anemia sec to chronic disease  HGb not at goal (9--11) HGb stable  4)CKD Mineral-Bone Disorder PTH not avail. Secondary Hyperparathyroidism w/u pending. Phosphorus  will check   5)Urology- Admitted with Hydronephrosis Urology and  Primary MD following  6)Electrolytes  Normokalemic  Hypernatremia   Now better ( 147==> 145)    7)Acid base Co2 not at goal NON AG acidosis sec to CKD + Post renal  8) Hypocalcemia  Calcium 8.2 +0.9=9.1 Calcium at goal after correction for low albumin   Plan:  Will  Start on on po bicarb for non anion gap acidosis Will ask for secondary hyperparathyroidism work up  Will follow on BMET to see GFr and acidosis    Cordarrell Sane s Wolfgang Phoenix 05/20/2019, 8:27 AM

## 2019-05-20 NOTE — Progress Notes (Addendum)
Patient ID: Brent Safe., male   DOB: 1924-07-11, 85 y.o.   MRN: [id number]  [employer] PROGRESS NOTE  Brent Safe. QIO:[id number] DOB: October 14, 1923 DOA: 05/17/2019 PCP: Jerl Mina, MD  HPI/Subjective: Patient was given Haldol last night for agitation and required a sitter.  Patient is a little slow with his answers but able to answer some questions.  Patient was telling stories about his favorite band.  Objective: Vitals:   05/20/19 0426 05/20/19 0936  BP: (!) 176/66 (!) 135/52  Pulse: (!) 55   Resp: 19   Temp: (!) 97.4 F (36.3 C)   SpO2: 99%     Intake/Output Summary (Last 24 hours) at 05/20/2019 1136 Last data filed at 05/20/2019 1003 Gross per 24 hour  Intake 2254.83 ml  Output 1800 ml  Net 454.83 ml   Filed Weights   05/17/19 1359  Weight: 72.6 kg    ROS: Review of Systems  Unable to perform ROS: Acuity of condition  Respiratory: Negative for shortness of breath.   Cardiovascular: Negative for chest pain.  Gastrointestinal: Negative for abdominal pain.   Exam: Physical Exam  Constitutional: He appears lethargic.  HENT:  Nose: No mucosal edema.  Mouth/Throat: No oropharyngeal exudate or posterior oropharyngeal edema.  Eyes: Pupils are equal, round, and reactive to light. Conjunctivae, EOM and lids are normal.  Neck: No JVD present. Carotid bruit is not present. No edema present. No thyroid mass and no thyromegaly present.  Cardiovascular: S1 normal and S2 normal. Exam reveals no gallop.  No murmur heard. Respiratory: No respiratory distress. He has decreased breath sounds in the right lower field and the left lower field. He has no wheezes. He has no rhonchi. He has no rales.  GI: Soft. Bowel sounds are normal. There is no abdominal tenderness.  Musculoskeletal:     Right ankle: He exhibits swelling.     Left ankle: He exhibits swelling.  Lymphadenopathy:    He has no cervical adenopathy.  Neurological: He appears lethargic.  Patient did  move his arms on his own.  Skin: Skin is warm. Nails show no clubbing.  Bruising bilateral arms up and down.  Patient has some scabs on his lower extremities.  Psychiatric:  Patient lethargic with medications given last night      Data Reviewed: Basic Metabolic Panel: Recent Labs  Lab 05/17/19 1406 05/18/19 0420 05/19/19 0330 05/20/19 0611  NA 147* 148* 147* 145  K 4.7 4.3 4.1 3.8  CL 112* 114* 119* 118*  CO2 18* 19* 20* 17*  GLUCOSE 75 94 104* 91  BUN 83* 93* 94* 79*  CREATININE 3.48* 3.54* 3.50* 2.97*  CALCIUM 9.5 8.9 8.5* 8.2*   Liver Function Tests: Recent Labs  Lab 05/17/19 1406 05/18/19 0420 05/20/19 0611  AST 83* 69* 28  ALT ALKPHOS 103 96 75  BILITOT 2.9* 1.9* 1.3*  PROT 7.0 6.1* 5.5*  ALBUMIN 3.6 3.3* 2.9*   CBC: Recent Labs  Lab 05/17/19 1406 05/18/19 0420 05/19/19 0330 05/20/19 0611  WBC 15.7* 15.5* 10.3 6.6  NEUTROABS 13.4*  --   --   --   HGB 9.7* 8.9* 7.8* 7.6*  HCT 30.5* 27.9* 25.4* 24.2*  MCV 90.8 90.9 92.7 92.0  PLT 98* 102* 91* 68*   Cardiac Enzymes: Recent Labs  Lab 05/17/19 1406 05/18/19 0420 05/19/19 0330  CKTOTAL 2,305* 1,012* 388     Recent Results (from the past 240 hour(s))  SARS Coronavirus 2 [facility] order, Performed in  [facility] hospital lab) Nasopharyngeal Nasopharyngeal Swab     Status: None   Collection Time: 05/17/19  3:17 PM   Specimen: Nasopharyngeal Swab  Result Value Ref Range Status   SARS Coronavirus 2 NEGATIVE NEGATIVE Final    Comment: (NOTE) If result is NEGATIVE SARS-CoV-2 target nucleic acids are NOT DETECTED. The SARS-CoV-2 RNA is generally detectable in upper and lower  respiratory specimens during the acute phase of infection. The lowest  concentration of SARS-CoV-2 viral copies this assay can detect is 250  copies / mL. A negative result does not preclude SARS-CoV-2 infection  and should not be used as the sole basis for treatment or other  patient management decisions.  A negative  result may occur with  improper specimen collection / handling, submission of specimen other  than nasopharyngeal swab, presence of viral mutation(s) within the  areas targeted by this assay, and inadequate number of viral copies  (<250 copies / mL). A negative result must be combined with clinical  observations, patient history, and epidemiological information. If result is POSITIVE SARS-CoV-2 target nucleic acids are DETECTED. The SARS-CoV-2 RNA is generally detectable in upper and lower  respiratory specimens dur ing the acute phase of infection.  Positive  results are indicative of active infection with SARS-CoV-2.  Clinical  correlation with patient history and other diagnostic information is  necessary to determine patient infection status.  Positive results do  not rule out bacterial infection or co-infection with other viruses. If result is PRESUMPTIVE POSTIVE SARS-CoV-2 nucleic acids MAY BE PRESENT.   A presumptive positive result was obtained on the submitted specimen  and confirmed on repeat testing.  While 2019 novel coronavirus  (SARS-CoV-2) nucleic acids may be present in the submitted sample  additional confirmatory testing may be necessary for epidemiological  and / or clinical management purposes  to differentiate between  SARS-CoV-2 and other Sarbecovirus currently known to infect humans.  If clinically indicated additional testing with an alternate test  methodology [phone]) is advised. The SARS-CoV-2 RNA is generally  detectable in upper and lower respiratory sp ecimens during the acute  phase of infection. The expected result is Negative. Fact Sheet for Patients:  StrictlyIdeas.no Fact Sheet for Healthcare Providers: BankingDealers.co.za This test is not yet approved or cleared by the [place] FDA and has been authorized for detection and/or diagnosis of SARS-CoV-2 by FDA under an Emergency Use Authorization  (EUA).  This EUA will remain in effect (meaning this test can be used) for the duration of the COVID-19 declaration under Section 564(b)(1) of the Act, 21 U.S.C. section 360bbb-3(b)(1), unless the authorization is terminated or revoked sooner. Performed at [facility], [address], [place], [place] [address]      Studies: Ct Abdomen Pelvis Wo Contrast  Result Date: 05/18/2019 CLINICAL DATA:  Hydronephrosis. EXAM: CT ABDOMEN AND PELVIS WITHOUT CONTRAST TECHNIQUE: Multidetector CT imaging of the abdomen and pelvis was performed following the standard protocol without IV contrast. COMPARISON:  Ultrasound of same day. FINDINGS: Lower chest: Small right pleural effusion is noted. Hepatobiliary: No focal liver abnormality is seen. No gallstones, gallbladder wall thickening, or biliary dilatation. Pancreas: Unremarkable. No pancreatic ductal dilatation or surrounding inflammatory changes. Spleen: Normal in size without focal abnormality. Adrenals/Urinary Tract: Adrenal glands appear normal. Right renal atrophy is noted with mild right hydroureteronephrosis without obstructing calculus. Moderate left hydroureteronephrosis is noted with nonobstructing 6 mm calculus seen in the distal left ureter. No definite evidence of obstructive calculus is noted. Moderate urinary bladder  distention is noted concerning for bladder outlet obstruction. Stomach/Bowel: Stomach is within normal limits. Appendix appears normal. No evidence of bowel wall thickening, distention, or inflammatory changes. Vascular/Lymphatic: Atherosclerosis of abdominal aorta is noted. 2.9 cm left common iliac artery aneurysm is noted. No adenopathy is noted. Reproductive: Mild prostatic enlargement is noted. Other: Midline surgical staples are noted anteriorly. Musculoskeletal: Severe degenerative changes seen involving the right hip. No acute abnormality is noted. IMPRESSION: Moderate left hydroureteronephrosis is noted with  nonobstructing 6 mm distal left ureteral calculus, but no evidence of obstructing ureteral calculus. Also noted is mild right hydroureteronephrosis without obstructing calculus. Moderate urinary bladder distention is noted concerning for bladder outlet obstruction. Mild prostatic enlargement is noted. 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 Small right pleural effusion. Aortic Atherosclerosis (ICD10-I70.0). Electronically Signed   By: Lupita Raider M.D.   On: 05/18/2019 12:50   Dg Abd 1 View  Result Date: 05/18/2019 CLINICAL DATA:  Possible left-sided kidney stone. History of smoking and aneurysm. EXAM: ABDOMEN - 1 VIEW COMPARISON:  05/18/2019, CT of the abdomen pelvis. FINDINGS: Renal shadows are mostly obscured by overlying bowel gas and stool. No convincing renal stone. No evidence of a ureteral stone. No bowel dilation to suggest obstruction. Surgical vascular clips project in the mid abdomen. There are sutures along the anterior abdominal wall midline. Dense aortoiliac artery vascular calcifications. Soft tissues otherwise unremarkable. No acute skeletal abnormality. There are advanced arthropathic changes of the right hip. IMPRESSION: 1. No acute findings. No radiographic evidence of a renal or ureteral stone. Electronically Signed   By: Amie Portland M.D.   On: 05/18/2019 17:48    Scheduled Meds: . amLODipine  5 mg Oral Daily  . brimonidine  1 drop Both Eyes BID  . Chlorhexidine Gluconate Cloth  6 each Topical Daily  . propranolol  60 mg Oral Daily  . QUEtiapine  12.5 mg Oral QHS  . tamsulosin  0.4 mg Oral Daily   Continuous Infusions: . sodium chloride 50 mL/hr at 05/20/19 0724    Assessment/Plan:  1. Acute kidney injury on chronic kidney disease stage III.  Creatinine is finally starting to move in the right direction.  Creatinine down to 2.97.  Baseline creatinine around 1.6-1.9.  Likely obstructive in nature.  Will need urology as outpatient.  Foley catheter placed.  On  Flomax. 2. Mild rhabdomyolysis.  Improved. 3. Thrombocytopenia.  Stop heparin subcutaneous injections and hold aspirin for right now.  Check platelet count tomorrow morning. 4. Hypernatremia secondary to dehydration. 5. Hypertension on amlodipine 6. 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 Anemia.  Decrease rate of IV fluids.  Hold aspirin. 8. Elevated liver function test likely with rhabdomyolysis. 9. Lethargy today secondary to receiving Haldol last night.  Will give a low-dose Seroquel tonight and get rid of the Haldol.  Code Status:     Code Status Orders  (From admission, onward)         Start     Ordered   05/17/19 1852  Full code  Continuous     05/17/19 1851        Code Status History    This patient has a current code status but no historical code status.   Advance Care Planning Activity     Family Communication: Spoke with son Gala Romney on the phone.  He said his brother and his wife Onalee Hua and Lynnell Dike will be here today and tomorrow.  Phone number is [phone]. Disposition Plan: Take things on a day-to-day basis on where  his kidney function is.  Consultants:  Nephrology  Urology  Time spent: 28 minutes  Dominika Losey [employer]  [employer]

## 2019-05-20 NOTE — Evaluation (Signed)
Clinical/Bedside Swallow Evaluation Patient Details  Name: Brent Carpenter. MRN: [id number] Date of Birth: Aug 08, 1924  Today's Date: 05/20/2019 Time: SLP Start Time (ACUTE ONLY): 42 SLP Stop Time (ACUTE ONLY): 1045 SLP Time Calculation (min) (ACUTE ONLY): 25 min  Past Medical History:  Past Medical History:  Diagnosis Date  . Aneurysm ([place])    In 1970's, LUQ   Past Surgical History: History reviewed. No pertinent surgical history. HPI:      Assessment / Plan / Recommendation Clinical Impression  pt presents with a moderate to severe oral pharyngeal dysphagia as characterized by poor bolus formation, lengthy  a-p transit and oral holding of bolus for 1-2 seconds . pt did have noted oral dysmotility and tremors when attempting to manipulate bolus/ straw. pt was given trials of thin liquid via cup and spoon. pt had mulitple instances of coughing with bolus trials of thin. pt had no ssx aspiration with honey or nectar thick liquid. st recommends dys 2 with nectar diet but educated nsg if pt has any coughi9ng with nectar to downgrade to honeyu. st will continue to follow for diet management and toleration SLP Visit Diagnosis: Dysphagia, oropharyngeal phase (R13.12)    Aspiration Risk  Moderate aspiration risk;Severe aspiration risk    Diet Recommendation Dysphagia 2 (Fine chop);Nectar-thick liquid   Liquid Administration via: Cup Medication Administration: Crushed with puree Supervision: Staff to assist with self feeding Compensations: Slow rate;Small sips/bites;Follow solids with liquid Postural Changes: Seated upright at 90 degrees    Other  Recommendations Oral Care Recommendations: Oral care BID Other Recommendations: Remove water pitcher   Follow up Recommendations        Frequency and Duration min 3x week  2 weeks       Prognosis Prognosis for Safe Diet Advancement: Fair Barriers to Reach Goals: Severity of deficits      Swallow Study   General Date of Onset:  05/20/19 Type of Study: Bedside Swallow Evaluation Diet Prior to this Study: Regular;Thin liquids Temperature Spikes Noted: No Respiratory Status: Room air History of Recent Intubation: No Behavior/Cognition: Alert;Cooperative;Pleasant mood Oral Cavity Assessment: Within Functional Limits Oral Care Completed by SLP: No Oral Cavity - Dentition: Missing dentition Vision: Functional for self-feeding Self-Feeding Abilities: Needs assist Patient Positioning: Upright in bed Baseline Vocal Quality: Normal Volitional Cough: Strong Volitional Swallow: Able to elicit    Oral/Motor/Sensory Function Overall Oral Motor/Sensory Function: Within functional limits   Ice Chips Ice chips: Within functional limits Presentation: Spoon   Thin Liquid Thin Liquid: Impaired Pharyngeal  Phase Impairments: Cough - Immediate;Cough - Delayed;Wet Vocal Quality    Nectar Thick Nectar Thick Liquid: Within functional limits Presentation: Cup   Honey Thick Honey Thick Liquid: Within functional limits Presentation: Cup   Puree Puree: Within functional limits Presentation: Spoon   Solid     Solid: Impaired Oral Phase Impairments: Reduced labial seal;Reduced lingual movement/coordination Oral Phase Functional Implications: Oral holding Pharyngeal Phase Impairments: Suspected delayed Swallow      Stacie Harris Sauber 05/20/2019,10:52 AM

## 2019-05-21 LAB — CBC
HCT: 25.1 % — ABNORMAL LOW (ref 39.0–52.0)
Hemoglobin: 7.7 g/dL — ABNORMAL LOW (ref 13.0–17.0)
MCH: 28.2 pg (ref 26.0–34.0)
MCHC: 30.7 g/dL (ref 30.0–36.0)
MCV: 91.9 fL (ref 80.0–100.0)
Platelets: 57 10*3/uL — ABNORMAL LOW (ref 150–400)
RBC: 2.73 MIL/uL — ABNORMAL LOW (ref 4.22–5.81)
RDW: 16.8 % — ABNORMAL HIGH (ref 11.5–15.5)
WBC: 5.9 10*3/uL (ref 4.0–10.5)
nRBC: 0 % (ref 0.0–0.2)

## 2019-05-21 LAB — BASIC METABOLIC PANEL
Anion gap: 8 (ref 5–15)
BUN: 67 mg/dL — ABNORMAL HIGH (ref 8–23)
CO2: 20 mmol/L — ABNORMAL LOW (ref 22–32)
Calcium: 8.4 mg/dL — ABNORMAL LOW (ref 8.9–10.3)
Chloride: 119 mmol/L — ABNORMAL HIGH (ref 98–111)
Creatinine, Ser: 2.59 mg/dL — ABNORMAL HIGH (ref 0.61–1.24)
GFR calc Af Amer: 24 mL/min — ABNORMAL LOW (ref >60)
GFR calc non Af Amer: 20 mL/min — ABNORMAL LOW (ref >60)
Glucose, Bld: 90 mg/dL (ref 70–99)
Potassium: 3.6 mmol/L (ref 3.5–5.1)
Sodium: 147 mmol/L — ABNORMAL HIGH (ref 135–145)

## 2019-05-21 LAB — HEMOGLOBIN: Hemoglobin: 8.2 g/dL — ABNORMAL LOW (ref 13.0–17.0)

## 2019-05-21 LAB — SARS CORONAVIRUS 2 (TAT 6-24 HRS): SARS Coronavirus 2: NEGATIVE

## 2019-05-21 LAB — PHOSPHORUS: Phosphorus: 3.4 mg/dL (ref 2.5–4.6)

## 2019-05-21 LAB — TYPE AND SCREEN
ABO/RH(D): A POS
Antibody Screen: NEGATIVE

## 2019-05-21 MED ORDER — QUETIAPINE FUMARATE 25 MG PO TABS: 12.5000 mg | ORAL_TABLET | Freq: Every evening | ORAL | Status: DC | PRN

## 2019-05-21 NOTE — Progress Notes (Signed)
Hulen Shouts.  MRN: [id number]  DOB/AGE: 01-20-1924 85 y.o.  Primary Care Physician:Hedrick, Jeneen Rinks, MD  Admit date: 05/17/2019  Chief Complaint:  Chief Complaint  Patient presents with  . Fall    S-Pt presented on  05/17/2019 with  Chief Complaint  Patient presents with  . Fall  .    Pt is sitting comfortably in no distress.    Pt offers no specific complaints  Meds . amLODipine  5 mg Oral Daily  . brimonidine  1 drop Both Eyes BID  . Chlorhexidine Gluconate Cloth  6 each Topical Daily  . propranolol  60 mg Oral Daily  . sodium bicarbonate  650 mg Oral BID  . tamsulosin  0.4 mg Oral Daily       Physical Exam: Vital signs in last 24 hours: Temp:  [97.5 F (36.4 C)-97.6 F (36.4 C)] 97.5 F (36.4 C) (09/27 0514) Pulse Rate:  [52-63] 60 (09/27 1033) Resp:  [18-20] 20 (09/27 0514) BP: (150-162)/(60-98) 151/95 (09/27 1033) SpO2:  [97 %-100 %] 97 % (09/27 0514) Weight change:  Last BM Date: 05/19/19  Intake/Output from previous day: 09/26 0701 - 09/27 0700 In: 2439 [P.O.:240; I.V.:2199] Out: 2450 [Urine:2450] Total I/O In: 1069.5 [I.V.:1069.5] Out: 550 [Urine:550]   Physical Exam: General- pt is awake,alert, oriented to time place and person Resp- No acute REsp distress, CTA B/L NO Rhonchi CVS- S1S2 regular ij rate and rhythm GIT- BS+, soft, NT, ND EXT- NO LE Edema, Cyanosis   Lab Results: CBC Recent Labs    05/20/19 0611 05/21/19 0456  WBC 6.6 5.9  HGB 7.6* 7.7*  HCT 24.2* 25.1*  PLT 68* 57*    BMET Recent Labs    05/20/19 0611 05/21/19 0456  NA 145 147*  K 3.8 3.6  CL 118* 119*  CO2 17* 20*  GLUCOSE 91 90  BUN 79* 67*  CREATININE 2.97* 2.59*  CALCIUM 8.2* 8.4*     Creat 2020  3.5=>2.97=> 2.59  MICRO Recent Results (from the past 240 hour(s))  SARS Coronavirus 2 [facility] order, Performed in [facility] hospital lab) Nasopharyngeal Nasopharyngeal Swab     Status: None   Collection Time: 05/17/19  3:17 PM   Specimen:  Nasopharyngeal Swab  Result Value Ref Range Status   SARS Coronavirus 2 NEGATIVE NEGATIVE Final    Comment: (NOTE) If result is NEGATIVE SARS-CoV-2 target nucleic acids are NOT DETECTED. The SARS-CoV-2 RNA is generally detectable in upper and lower  respiratory specimens during the acute phase of infection. The lowest  concentration of SARS-CoV-2 viral copies this assay can detect is 250  copies / mL. A negative result does not preclude SARS-CoV-2 infection  and should not be used as the sole basis for treatment or other  patient management decisions.  A negative result may occur with  improper specimen collection / handling, submission of specimen other  than nasopharyngeal swab, presence of viral mutation(s) within the  areas targeted by this assay, and inadequate number of viral copies  (<250 copies / mL). A negative result must be combined with clinical  observations, patient history, and epidemiological information. If result is POSITIVE SARS-CoV-2 target nucleic acids are DETECTED. The SARS-CoV-2 RNA is generally detectable in upper and lower  respiratory specimens dur ing the acute phase of infection.  Positive  results are indicative of active infection with SARS-CoV-2.  Clinical  correlation with patient history and other diagnostic information is  necessary to determine patient infection status.  Positive results do  not rule out bacterial infection or co-infection with other viruses. If result is PRESUMPTIVE POSTIVE SARS-CoV-2 nucleic acids MAY BE PRESENT.   A presumptive positive result was obtained on the submitted specimen  and confirmed on repeat testing.  While 2019 novel coronavirus  (SARS-CoV-2) nucleic acids may be present in the submitted sample  additional confirmatory testing may be necessary for epidemiological  and / or clinical management purposes  to differentiate between  SARS-CoV-2 and other Sarbecovirus currently known to infect humans.  If clinically  indicated additional testing with an alternate test  methodology [phone]) is advised. The SARS-CoV-2 RNA is generally  detectable in upper and lower respiratory sp ecimens during the acute  phase of infection. The expected result is Negative. Fact Sheet for Patients:  BoilerBrush.com.cyhttps://www.fda.gov/media/136312/download Fact Sheet for Healthcare Providers: https://pope.com/https://www.fda.gov/media/136313/download This test is not yet approved or cleared by the [place] FDA and has been authorized for detection and/or diagnosis of SARS-CoV-2 by FDA under an Emergency Use Authorization (EUA).  This EUA will remain in effect (meaning this test can be used) for the duration of the COVID-19 declaration under Section 564(b)(1) of the Act, 21 U.S.C. section 360bbb-3(b)(1), unless the authorization is terminated or revoked sooner. Performed at [facility], [address] [place], [place] [address]   SARS CORONAVIRUS 2 (TAT 6-24 HRS) Nasopharyngeal Nasopharyngeal Swab     Status: None   Collection Time: 05/20/19  3:38 PM   Specimen: Nasopharyngeal Swab  Result Value Ref Range Status   SARS Coronavirus 2 NEGATIVE NEGATIVE Final    Comment: (NOTE) SARS-CoV-2 target nucleic acids are NOT DETECTED. The SARS-CoV-2 RNA is generally detectable in upper and lower respiratory specimens during the acute phase of infection. Negative results do not preclude SARS-CoV-2 infection, do not rule out co-infections with other pathogens, and should not be used as the sole basis for treatment or other patient management decisions. Negative results must be combined with clinical observations, patient history, and epidemiological information. The expected result is Negative. Fact Sheet for Patients: HairSlick.nohttps://www.fda.gov/media/138098/download Fact Sheet for Healthcare Providers: quierodirigir.comhttps://www.fda.gov/media/138095/download This test is not yet approved or cleared by the [place] FDA and  has been authorized for  detection and/or diagnosis of SARS-CoV-2 by FDA under an Emergency Use Authorization (EUA). This EUA will remain  in effect (meaning this test can be used) for the duration of the COVID-19 declaration under Section 56 4(b)(1) of the Act, 21 U.S.C. section 360bbb-3(b)(1), unless the authorization is terminated or revoked sooner. Performed at [facility], [address] [place], [place] [address]       Lab Results  Component Value Date   CALCIUM 8.4 (L) 05/21/2019   PHOS 3.4 05/21/2019   Albumin  2.9         Impression:  Pt is a [age] year ol;d caucasian male with past medical hx of Hydronephrosis( in Feb 2020) who was brought to ER with c/o  Fall and upon admission pt was found to have AKI, Anemia,Hypernatremia and hydronephrosis    1)Renal  AKI secondary to Post renal                AKI on CKD               CKD stage 4.               CKD since               CKD secondary to POst renal/Age asso decline  Progression of CKD marked with AKI                AKI now better.  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 Secondary Hyperparathyroidism w/u pending. Phosphorus  At goal  5)Urology- Admitted with Hydronephrosis Urology and Primary MD following  6)Electrolytes  Normokalemic  Hypernatremia   Will encourage free water intake   7)Acid base Co2 near to goal NON AG acidosis sec to CKD + Post renal On PO bicarb  8) Hypocalcemia  Calcium 8.4 +0.9=9.3 Calcium at goal after correction for low albumin   Plan:  Will  encourage free water for hypernatremia    Caroll Weinheimer s Kabella Cassidy 05/21/2019, 11:09 AM

## 2019-05-21 NOTE — Progress Notes (Signed)
Patient ID: Brent Carpenter., male   DOB: 07-15-1924, 85 y.o.   MRN: [id number]  [employer] PROGRESS NOTE  Brent Carpenter. BOF:[id number] DOB: Dec 18, 1923 DOA: 05/17/2019 PCP: Jerl Mina, MD  HPI/Subjective: Patient was seen this morning.  Patient received Seroquel last night to sleep.  Was a little cloudy this morning.  Answered a few questions.  Objective: Vitals:   05/21/19 1033 05/21/19 1302  BP: (!) 151/95 (!) 164/60  Pulse: 60 (!) 55  Resp:  18  Temp:  (!) 96.8 F (36 C)  SpO2:  97%    Intake/Output Summary (Last 24 hours) at 05/21/2019 1330 Last data filed at 05/21/2019 1035 Gross per 24 hour  Intake 1069.5 ml  Output 3000 ml  Net -1930.5 ml   Filed Weights   05/17/19 1359  Weight: 72.6 kg    ROS: Review of Systems  Unable to perform ROS: Acuity of condition  Respiratory: Negative for shortness of breath.   Cardiovascular: Negative for chest pain.  Gastrointestinal: Negative for abdominal pain.   Exam: Physical Exam  Constitutional: He appears lethargic.  HENT:  Nose: No mucosal edema.  Mouth/Throat: No oropharyngeal exudate or posterior oropharyngeal edema.  Eyes: Pupils are equal, round, and reactive to light. Conjunctivae, EOM and lids are normal.  Neck: No JVD present. Carotid bruit is not present. No edema present. No thyroid mass and no thyromegaly present.  Cardiovascular: S1 normal and S2 normal. Exam reveals no gallop.  No murmur heard. Respiratory: No respiratory distress. He has decreased breath sounds in the right lower field and the left lower field. He has no wheezes. He has no rhonchi. He has no rales.  GI: Soft. Bowel sounds are normal. There is no abdominal tenderness.  Musculoskeletal:     Right ankle: He exhibits swelling.     Left ankle: He exhibits swelling.  Lymphadenopathy:    He has no cervical adenopathy.  Neurological: He appears lethargic.  Patient did move his arms on his own.  Skin: Skin is warm. Nails show no  clubbing.  Bruising bilateral arms up and down.  Patient has some scabs on his lower extremities.  Psychiatric:  Patient lethargic with medications given last night      Data Reviewed: Basic Metabolic Panel: Recent Labs  Lab 05/17/19 1406 05/18/19 0420 05/19/19 0330 05/20/19 0611 05/21/19 0456  NA 147* 148* 147* 145 147*  K 4.7 4.3 4.1 3.8 3.6  CL 112* 114* 119* 118* 119*  CO2 18* 19* 20* 17* 20*  GLUCOSE 75 94 104* 91 90  BUN 83* 93* 94* 79* 67*  CREATININE 3.48* 3.54* 3.50* 2.97* 2.59*  CALCIUM 9.5 8.9 8.5* 8.2* 8.4*  PHOS  --   --   --   --  3.4   Liver Function Tests: Recent Labs  Lab 05/17/19 1406 05/18/19 0420 05/20/19 0611  AST 83* 69* 28  ALT 28 30 30   ALKPHOS 103 96 75  BILITOT 2.9* 1.9* 1.3*  PROT 7.0 6.1* 5.5*  ALBUMIN 3.6 3.3* 2.9*   CBC: Recent Labs  Lab 05/17/19 1406 05/18/19 0420 05/19/19 0330 05/20/19 0611 05/21/19 0456 05/21/19 1242  WBC 15.7* 15.5* 10.3 6.6 5.9  --   NEUTROABS 13.4*  --   --   --   --   --   HGB 9.7* 8.9* 7.8* 7.6* 7.7* 8.2*  HCT 30.5* 27.9* 25.4* 24.2* 25.1*  --   MCV 90.8 90.9 92.7 92.0 91.9  --   PLT 98* 102* 91* 68*  57*  --    Cardiac Enzymes: Recent Labs  Lab 05/17/19 1406 05/18/19 0420 05/19/19 0330  CKTOTAL 2,305* 1,012* 388     Recent Results (from the past 240 hour(s))  SARS Coronavirus 2 [facility](Hospital order, Performed in [facility] hospital lab) Nasopharyngeal Nasopharyngeal Swab     Status: None   Collection Time: 05/17/19  3:17 PM   Specimen: Nasopharyngeal Swab  Result Value Ref Range Status   SARS Coronavirus 2 NEGATIVE NEGATIVE Final    Comment: (NOTE) If result is NEGATIVE SARS-CoV-2 target nucleic acids are NOT DETECTED. The SARS-CoV-2 RNA is generally detectable in upper and lower  respiratory specimens during the acute phase of infection. The lowest  concentration of SARS-CoV-2 viral copies this assay can detect is 250  copies / mL. A negative result does not preclude SARS-CoV-2 infection   and should not be used as the sole basis for treatment or other  patient management decisions.  A negative result may occur with  improper specimen collection / handling, submission of specimen other  than nasopharyngeal swab, presence of viral mutation(s) within the  areas targeted by this assay, and inadequate number of viral copies  (<250 copies / mL). A negative result must be combined with clinical  observations, patient history, and epidemiological information. If result is POSITIVE SARS-CoV-2 target nucleic acids are DETECTED. The SARS-CoV-2 RNA is generally detectable in upper and lower  respiratory specimens dur ing the acute phase of infection.  Positive  results are indicative of active infection with SARS-CoV-2.  Clinical  correlation with patient history and other diagnostic information is  necessary to determine patient infection status.  Positive results do  not rule out bacterial infection or co-infection with other viruses. If result is PRESUMPTIVE POSTIVE SARS-CoV-2 nucleic acids MAY BE PRESENT.   A presumptive positive result was obtained on the submitted specimen  and confirmed on repeat testing.  While 2019 novel coronavirus  (SARS-CoV-2) nucleic acids may be present in the submitted sample  additional confirmatory testing may be necessary for epidemiological  and / or clinical management purposes  to differentiate between  SARS-CoV-2 and other Sarbecovirus currently known to infect humans.  If clinically indicated additional testing with an alternate test  methodology [phone]([id number]) is advised. The SARS-CoV-2 RNA is generally  detectable in upper and lower respiratory sp ecimens during the acute  phase of infection. The expected result is Negative. Fact Sheet for Patients:  BoilerBrush.com.cyhttps://www.fda.gov/media/136312/download Fact Sheet for Healthcare Providers: https://pope.com/https://www.fda.gov/media/136313/download This test is not yet approved or cleared by the [place] FDA  and has been authorized for detection and/or diagnosis of SARS-CoV-2 by FDA under an Emergency Use Authorization (EUA).  This EUA will remain in effect (meaning this test can be used) for the duration of the COVID-19 declaration under Section 564(b)(1) of the Act, 21 U.S.C. section 360bbb-3(b)(1), unless the authorization is terminated or revoked sooner. Performed at [facility], [address] [place], [place] [address]   SARS CORONAVIRUS 2 (TAT 6-24 HRS) Nasopharyngeal Nasopharyngeal Swab     Status: None   Collection Time: 05/20/19  3:38 PM   Specimen: Nasopharyngeal Swab  Result Value Ref Range Status   SARS Coronavirus 2 NEGATIVE NEGATIVE Final    Comment: (NOTE) SARS-CoV-2 target nucleic acids are NOT DETECTED. The SARS-CoV-2 RNA is generally detectable in upper and lower respiratory specimens during the acute phase of infection. Negative results do not preclude SARS-CoV-2 infection, do not rule out co-infections with other pathogens, and should not be used  as the sole basis for treatment or other patient management decisions. Negative results must be combined with clinical observations, patient history, and epidemiological information. The expected result is Negative. Fact Sheet for Patients: SugarRoll.be Fact Sheet for Healthcare Providers: https://www.woods-mathews.com/ This test is not yet approved or cleared by the [place] FDA and  has been authorized for detection and/or diagnosis of SARS-CoV-2 by FDA under an Emergency Use Authorization (EUA). This EUA will remain  in effect (meaning this test can be used) for the duration of the COVID-19 declaration under Section 56 4(b)(1) of the Act, 21 U.S.C. section 360bbb-3(b)(1), unless the authorization is terminated or revoked sooner. Performed at [facility], [place] [address], [place], [place] [address]      Studies: No results found.  Scheduled Meds: .  amLODipine  5 mg Oral Daily  . brimonidine  1 drop Both Eyes BID  . Chlorhexidine Gluconate Cloth  6 each Topical Daily  . propranolol  60 mg Oral Daily  . sodium bicarbonate  650 mg Oral BID  . tamsulosin  0.4 mg Oral Daily   Continuous Infusions: . sodium chloride 50 mL/hr at 05/21/19 1035    Assessment/Plan:  1. Acute kidney injury on chronic kidney disease stage III.  Creatinine is finally starting to move in the right direction.  Creatinine down to 2.59.  Baseline creatinine around 1.6-1.9.  Likely obstructive in nature.  Will need urology as outpatient.  Foley catheter placed.  On Flomax. 2. Hematuria likely from pulling on Foley catheter.  Continue to monitor. 3. Mild rhabdomyolysis.  Improved. 4. Thrombocytopenia.  Platelet count still trending down.  Heparin subcu stopped yesterday and aspirin stopped.  Continue to monitor platelet count.  Once stabilizes potentially can get out of the hospital. 5. Hypernatremia secondary to dehydration. 6. Hypertension on amlodipine 7. 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 Anemia.  Decrease rate of IV fluids.  Hold aspirin.  Repeat hemoglobin today better at 8.2.  Hold off on transfusion for now.  Recheck tomorrow morning. 9. Elevated liver function test likely with rhabdomyolysis. 10. Lethargy this morning likely secondary to Seroquel.  Make Seroquel as needed for agitation or insomnia.  Code Status:     Code Status Orders  (From admission, onward)         Start     Ordered   05/17/19 1852  Full code  Continuous     05/17/19 1851        Code Status History    This patient has a current code status but no historical code status.   Advance Care Planning Activity     Family Communication: Spoke with Susie on the phone [phone]. Disposition Plan: Would like to see platelet counts stabilize, hematuria cleared up.  Kidney function is actually moving in the right direction now.  Consultants:  Nephrology  Urology  Time spent: 30  minutes.  Spoke with urology and family on the phone and nursing staff.  Alyzae Hawkey [employer]

## 2019-05-21 NOTE — Progress Notes (Signed)
3 Days Post-Op Subjective: The patient developed gross hematuria overnight after pulling on his foley. He denies any pain. Urine dark red but foley is draining  Objective: Vital signs in last 24 hours: Temp:  [96.8 F (36 C)-98 F (36.7 C)] 98 F (36.7 C) (09/27 1352) Pulse Rate:  [53-63] 55 (09/27 1302) Resp:  [18-20] 18 (09/27 1302) BP: (150-164)/(60-98) 164/60 (09/27 1302) SpO2:  [97 %-100 %] 97 % (09/27 1302)  Intake/Output from previous day: 09/26 0701 - 09/27 0700 In: 2439 [P.O.:240; I.V.:2199] Out: 2450 [Urine:2450] Intake/Output this shift: No intake/output data recorded.  Physical Exam:  General:alert and cachectic GI: soft, non tender, normal bowel sounds, no palpable masses, no organomegaly, no inguinal hernia Male genitalia: not done Extremities: extremities normal, atraumatic, no cyanosis or edema  Lab Results: Recent Labs    05/19/19 0330 05/20/19 0611 05/21/19 0456 05/21/19 1242  HGB 7.8* 7.6* 7.7* 8.2*  HCT 25.4* 24.2* 25.1*  --    BMET Recent Labs    05/20/19 0611 05/21/19 0456  NA 145 147*  K 3.8 3.6  CL 118* 119*  CO2 17* 20*  GLUCOSE 91 90  BUN 79* 67*  CREATININE 2.97* 2.59*  CALCIUM 8.2* 8.4*   No results for input(s): LABPT, INR in the last 72 hours. No results for input(s): LABURIN in the last 72 hours. Results for orders placed or performed during the hospital encounter of 05/17/19  SARS Coronavirus 2 [facility] order, Performed in [facility] hospital lab) Nasopharyngeal Nasopharyngeal Swab     Status: None   Collection Time: 05/17/19  3:17 PM   Specimen: Nasopharyngeal Swab  Result Value Ref Range Status   SARS Coronavirus 2 NEGATIVE NEGATIVE Final    Comment: (NOTE) If result is NEGATIVE SARS-CoV-2 target nucleic acids are NOT DETECTED. The SARS-CoV-2 RNA is generally detectable in upper and lower  respiratory specimens during the acute phase of infection. The lowest  concentration of SARS-CoV-2 viral copies this assay can  detect is 250  copies / mL. A negative result does not preclude SARS-CoV-2 infection  and should not be used as the sole basis for treatment or other  patient management decisions.  A negative result may occur with  improper specimen collection / handling, submission of specimen other  than nasopharyngeal swab, presence of viral mutation(s) within the  areas targeted by this assay, and inadequate number of viral copies  (<250 copies / mL). A negative result must be combined with clinical  observations, patient history, and epidemiological information. If result is POSITIVE SARS-CoV-2 target nucleic acids are DETECTED. The SARS-CoV-2 RNA is generally detectable in upper and lower  respiratory specimens dur ing the acute phase of infection.  Positive  results are indicative of active infection with SARS-CoV-2.  Clinical  correlation with patient history and other diagnostic information is  necessary to determine patient infection status.  Positive results do  not rule out bacterial infection or co-infection with other viruses. If result is PRESUMPTIVE POSTIVE SARS-CoV-2 nucleic acids MAY BE PRESENT.   A presumptive positive result was obtained on the submitted specimen  and confirmed on repeat testing.  While 2019 novel coronavirus  (SARS-CoV-2) nucleic acids may be present in the submitted sample  additional confirmatory testing may be necessary for epidemiological  and / or clinical management purposes  to differentiate between  SARS-CoV-2 and other Sarbecovirus currently known to infect humans.  If clinically indicated additional testing with an alternate test  methodology [phone] is advised. The SARS-CoV-2 RNA is generally  detectable in upper and lower respiratory sp ecimens during the acute  phase of infection. The expected result is Negative. Fact Sheet for Patients:  BoilerBrush.com.cy Fact Sheet for Healthcare  Providers: https://pope.com/ This test is not yet approved or cleared by the [place] FDA and has been authorized for detection and/or diagnosis of SARS-CoV-2 by FDA under an Emergency Use Authorization (EUA).  This EUA will remain in effect (meaning this test can be used) for the duration of the COVID-19 declaration under Section 564(b)(1) of the Act, 21 U.S.C. section 360bbb-3(b)(1), unless the authorization is terminated or revoked sooner. Performed at [facility], [address] [place], [place] [address]   SARS CORONAVIRUS 2 (TAT 6-24 HRS) Nasopharyngeal Nasopharyngeal Swab     Status: None   Collection Time: 05/20/19  3:38 PM   Specimen: Nasopharyngeal Swab  Result Value Ref Range Status   SARS Coronavirus 2 NEGATIVE NEGATIVE Final    Comment: (NOTE) SARS-CoV-2 target nucleic acids are NOT DETECTED. The SARS-CoV-2 RNA is generally detectable in upper and lower respiratory specimens during the acute phase of infection. Negative results do not preclude SARS-CoV-2 infection, do not rule out co-infections with other pathogens, and should not be used as the sole basis for treatment or other patient management decisions. Negative results must be combined with clinical observations, patient history, and epidemiological information. The expected result is Negative. Fact Sheet for Patients: HairSlick.no Fact Sheet for Healthcare Providers: quierodirigir.com This test is not yet approved or cleared by the [place] FDA and  has been authorized for detection and/or diagnosis of SARS-CoV-2 by FDA under an Emergency Use Authorization (EUA). This EUA will remain  in effect (meaning this test can be used) for the duration of the COVID-19 declaration under Section 56 4(b)(1) of the Act, 21 U.S.C. section 360bbb-3(b)(1), unless the authorization is terminated or revoked sooner. Performed at  [facility], [address], [place], [place] [address]     Studies/Results: No results found.  Assessment/Plan: 85yo with new onset gross hematuria  1. Please continue foley catheter to straight drain. The urine is currently clearing and the patient does not require CBI at this time.    LOS: 4 days   Wilkie Aye 05/21/2019, 8:18 PM

## 2019-05-21 NOTE — Progress Notes (Signed)
Patient's son Shanon Brow came to visit patient and given an update. Son stated he will be back on Sunday 9/27 to take patient to SNF. Son stated he would like to speak to the doctor in the morning.

## 2019-05-22 LAB — CBC
HCT: 27.1 % — ABNORMAL LOW (ref 39.0–52.0)
Hemoglobin: 8.5 g/dL — ABNORMAL LOW (ref 13.0–17.0)
MCH: 28.7 pg (ref 26.0–34.0)
MCHC: 31.4 g/dL (ref 30.0–36.0)
MCV: 91.6 fL (ref 80.0–100.0)
Platelets: 60 10*3/uL — ABNORMAL LOW (ref 150–400)
RBC: 2.96 MIL/uL — ABNORMAL LOW (ref 4.22–5.81)
RDW: 16.5 % — ABNORMAL HIGH (ref 11.5–15.5)
WBC: 7 10*3/uL (ref 4.0–10.5)
nRBC: 0 % (ref 0.0–0.2)

## 2019-05-22 LAB — BASIC METABOLIC PANEL
Anion gap: 8 (ref 5–15)
BUN: 57 mg/dL — ABNORMAL HIGH (ref 8–23)
CO2: 22 mmol/L (ref 22–32)
Calcium: 8.5 mg/dL — ABNORMAL LOW (ref 8.9–10.3)
Chloride: 118 mmol/L — ABNORMAL HIGH (ref 98–111)
Creatinine, Ser: 2.3 mg/dL — ABNORMAL HIGH (ref 0.61–1.24)
GFR calc Af Amer: 27 mL/min — ABNORMAL LOW (ref >60)
GFR calc non Af Amer: 24 mL/min — ABNORMAL LOW (ref >60)
Glucose, Bld: 95 mg/dL (ref 70–99)
Potassium: 3.5 mmol/L (ref 3.5–5.1)
Sodium: 148 mmol/L — ABNORMAL HIGH (ref 135–145)

## 2019-05-22 MED ORDER — PROPRANOLOL HCL 40 MG PO TABS: 40.0000 mg | ORAL_TABLET | Freq: Every day | ORAL | 0 refills | Status: AC

## 2019-05-22 MED ORDER — ACETAMINOPHEN 325 MG PO TABS: 650.0000 mg | ORAL_TABLET | Freq: Four times a day (QID) | ORAL | Status: AC | PRN

## 2019-05-22 MED ORDER — POTASSIUM CHLORIDE 20 MEQ PO PACK
20.0000 meq | PACK | Freq: Once | ORAL | Status: AC
  Administered 2019-05-22: 20 meq via ORAL
  Filled 2019-05-22: qty 1

## 2019-05-22 MED ORDER — SODIUM CHLORIDE 0.45 % IV SOLN
INTRAVENOUS | Status: DC
  Administered 2019-05-22: 09:00:00 via INTRAVENOUS

## 2019-05-22 MED ORDER — AMLODIPINE BESYLATE 5 MG PO TABS: 5.0000 mg | ORAL_TABLET | Freq: Every day | ORAL | 0 refills | Status: AC

## 2019-05-22 MED ORDER — SODIUM BICARBONATE 650 MG PO TABS: 650.0000 mg | ORAL_TABLET | Freq: Two times a day (BID) | ORAL | 0 refills | Status: AC

## 2019-05-22 MED ORDER — POLYETHYLENE GLYCOL 3350 17 G PO PACK: 17.0000 g | PACK | Freq: Every day | ORAL | 0 refills | Status: AC | PRN

## 2019-05-22 MED ORDER — TAMSULOSIN HCL 0.4 MG PO CAPS: 0.4000 mg | ORAL_CAPSULE | Freq: Every day | ORAL | 0 refills | Status: AC

## 2019-05-22 NOTE — Care Management Important Message (Signed)
Important Message  Patient Details  Name: Brent Carpenter. MRN: [id number] Date of Birth: [birth year]   Medicare Important Message Given:  Yes     Dannette Barbara 05/22/2019, 12:08 PM

## 2019-05-22 NOTE — Progress Notes (Signed)
Hulen Shouts.  A and O x 2. VSS. Pt tolerating diet well. No complaints of pain or nausea. IV removed intact, prescriptions given to pt's son.  Pt's son voiced understanding of discharge instructions with no further questions. Pt discharge with foley catheter. Pt discharged via wheelchair with RN and NT.   Allergies as of 05/22/2019      Reactions   Other Other (See Comments)   Newspaper - makes him sneeze      Medication List    STOP taking these medications   aspirin EC 81 MG tablet     TAKE these medications   acetaminophen 325 MG tablet Commonly known as: TYLENOL Take 2 tablets (650 mg total) by mouth every 6 (six) hours as needed for mild pain (or Fever >/= 101). Notes to patient: As needed   Alphagan P 0.1 % Soln Generic drug: brimonidine Place 1 drop into both eyes 2 (two) times daily. Notes to patient: Before bed 05/22/19   amLODipine 5 MG tablet Commonly known as: NORVASC Take 1 tablet (5 mg total) by mouth daily. Start taking on: May 23, 2019 Notes to patient: Morning 05/23/19   polyethylene glycol 17 g packet Commonly known as: MIRALAX / GLYCOLAX Take 17 g by mouth daily as needed for mild constipation. Notes to patient: As needed   propranolol 40 MG tablet Commonly known as: INDERAL Take 1 tablet (40 mg total) by mouth daily. What changed:   medication strength  how much to take Notes to patient: Morning 05/23/19   sodium bicarbonate 650 MG tablet Take 1 tablet (650 mg total) by mouth 2 (two) times daily. Notes to patient: Before bed 05/22/19   tamsulosin 0.4 MG Caps capsule Commonly known as: FLOMAX Take 1 capsule (0.4 mg total) by mouth daily. Start taking on: May 23, 2019 Notes to patient: Morning 05/23/19       Vitals:   05/22/19 0548 05/22/19 1320  BP: (!) 128/59 (!) 154/73  Pulse: (!) 51 65  Resp: 18 16  Temp: 97.6 F (36.4 C) (!) 97.5 F (36.4 C)  SpO2: 99% 100%    Francesco Sor

## 2019-05-22 NOTE — Progress Notes (Signed)
Physical Therapy Treatment Patient Details Name: Brent Carpenter. MRN: [id number] DOB: 05-05-1924 Today's Date: 05/22/2019    History of Present Illness Per MD: Brent Carpenter  is a 85 y.o. male with no significant past medical history who presented to the ED after being found down by a home health provider. Patient is a little confused, so history is obtained from the chart and the ED provider. Patient apparently thinks he may have tripped on thick carpet. He denies any chest pain, shortness of breath, abdominal pain, hip pain, knee pain. In the ED, he was hypertensive.  Vitals were otherwise unremarkable. CT head and C-spine were negative for acute abnormalities, admitted with ARF and mild rhabdo. PMH includes: Falls, cardioversion, substance abuse, tremor.    PT Comments    Pt presented with deficits in strength, transfers, mobility, gait, and balance. Pt was lethargic at first but by end of session was conversational, saying that it felt good to get up and move. Pt completed bed exercise as AAROM with min A, and supine to sit with cuing and min A. Pt completed sit<>stand transfer with min A and cuing using the RW. Once standing, pt was eager to walk around the room and did so with close CGA. Pt was left in chair with SW. Pt will benefit from PT services in a SNF setting upon discharge to safely address above deficits for decreased caregiver assistance and eventual return to PLOF.   Follow Up Recommendations  SNF     Equipment Recommendations       Recommendations for Other Services       Precautions / Restrictions Precautions Precautions: Fall Precaution Comments: High Fall Risk Restrictions Weight Bearing Restrictions: No    Mobility  Bed Mobility Overal bed mobility: Needs Assistance Bed Mobility: Rolling;Supine to Sit Rolling: Min assist   Supine to sit: Min assist     General bed mobility comments: Pt was agreeable sit up on the EOB, but still needed verbal cuing to  coordinate movement and min A to complete sitting up  Transfers Overall transfer level: Needs assistance Equipment used: Rolling walker (2 wheeled) Transfers: Sit to/from Stand Sit to Stand: Min assist         General transfer comment: Pt required min cuing and min A to stand  Ambulation/Gait Ambulation/Gait assistance: Min guard Gait Distance (Feet): 16 Feet Assistive device: Rolling walker (2 wheeled) Gait Pattern/deviations: Shuffle;Step-through pattern;Decreased step length - right;Decreased step length - left Gait velocity: Decreased   General Gait Details: Pt followed cues to stay close to the walker, turn, and walk several steps backwards   Stairs             Wheelchair Mobility    Modified Rankin (Stroke Patients Only)       Balance Overall balance assessment: Needs assistance Sitting-balance support: Single extremity supported;Feet unsupported Sitting balance-Leahy Scale: Fair Sitting balance - Comments: Pt required min cuing to prevent post LOB with even minor perturbations Postural control: Posterior lean Standing balance support: Bilateral upper extremity supported Standing balance-Leahy Scale: Fair                              Cognition Arousal/Alertness: Awake/alert Behavior During Therapy: WFL for tasks assessed/performed Overall Cognitive Status: No family/caregiver present to determine baseline cognitive functioning  General Comments: Pt is HOH and had difficulty engaging and answering any questions, but became more talkative after 5-10 minutes      Exercises Total Joint Exercises Ankle Circles/Pumps: AROM;Strengthening;Both;10 reps;15 reps Short Arc Quad: AAROM;Strengthening;Both;10 reps;15 reps Hip ABduction/ADduction: AAROM;Both;10 reps;15 reps Straight Leg Raises: AAROM;Both;5 reps;10 reps Knee Flexion: AROM;Strengthening;Both;10 reps Marching in Standing:  AROM;Strengthening;Both;5 reps;10 reps    General Comments        Pertinent Vitals/Pain Pain Assessment: No/denies pain    Home Living                      Prior Function            PT Goals (current goals can now be found in the care plan section) Progress towards PT goals: Progressing toward goals    Frequency    Min 2X/week      PT Plan Current plan remains appropriate    Co-evaluation              AM-PAC PT "6 Clicks" Mobility   Outcome Measure  Help needed turning from your back to your side while in a flat bed without using bedrails?: A Little Help needed moving from lying on your back to sitting on the side of a flat bed without using bedrails?: A Lot Help needed moving to and from a bed to a chair (including a wheelchair)?: A Lot Help needed standing up from a chair using your arms (e.g., wheelchair or bedside chair)?: A Little Help needed to walk in hospital room?: A Little Help needed climbing 3-5 steps with a railing? : A Lot 6 Click Score: 15    End of Session Equipment Utilized During Treatment: Gait belt Activity Tolerance: Patient tolerated treatment well Patient left: in chair;with call bell/phone within reach;with nursing/sitter in room;with SCD's reapplied(Social worker in room with pt at end of session) Nurse Communication: Mobility status PT Visit Diagnosis: Unsteadiness on feet (R26.81);Difficulty in walking, not elsewhere classified (R26.2);Repeated falls (R29.6);Muscle weakness (generalized) (M62.81)     Time: 1030-1110 PT Time Calculation (min) (ACUTE ONLY): 40 min  Charges:                        Juanda Crumble "Gus" Mecca Barga, SPT  05/22/19, 1:23 PM

## 2019-05-22 NOTE — Discharge Instructions (Signed)
Acute Kidney Injury, Adult ° °Acute kidney injury is a sudden worsening of kidney function. The kidneys are organs that have several jobs. They filter the blood to remove waste products and extra fluid. They also maintain a healthy balance of minerals and hormones in the body, which helps control blood pressure and keep bones strong. With this condition, your kidneys do not do their jobs as well as they should. °This condition ranges from mild to severe. Over time it may develop into long-lasting (chronic) kidney disease. Early detection and treatment may prevent acute kidney injury from developing into a chronic condition. °What are the causes? °Common causes of this condition include: °· A problem with blood flow to the kidneys. This may be caused by: °? Low blood pressure (hypotension) or shock. °? Blood loss. °? Heart and blood vessel (cardiovascular) disease. °? Severe burns. °? Liver disease. °· Direct damage to the kidneys. This may be caused by: °? Certain medicines. °? A kidney infection. °? Poisoning. °? Being around or in contact with toxic substances. °? A surgical wound. °? A hard, direct hit to the kidney area. °· A sudden blockage of urine flow. This may be caused by: °? Cancer. °? Kidney stones. °? An enlarged prostate in males. °What are the signs or symptoms? °Symptoms of this condition may not be obvious until the condition becomes severe. Symptoms of this condition can include: °· Tiredness (lethargy), or difficulty staying awake. °· Nausea or vomiting. °· Swelling (edema) of the face, legs, ankles, or feet. °· Problems with urination, such as: °? Abdominal pain, or pain along the side of your stomach (flank). °? Decreased urine production. °? Decrease in the force of urine flow. °· Muscle twitches and cramps, especially in the legs. °· Confusion or trouble concentrating. °· Loss of appetite. °· Fever. °How is this diagnosed? °This condition may be diagnosed with tests, including: °· Blood  tests. °· Urine tests. °· Imaging tests. °· A test in which a sample of tissue is removed from the kidneys to be examined under a microscope (kidney biopsy). °How is this treated? °Treatment for this condition depends on the cause and how severe the condition is. In mild cases, treatment may not be needed. The kidneys may heal on their own. In more severe cases, treatment will involve: °· Treating the cause of the kidney injury. This may involve changing any medicines you are taking or adjusting your dosage. °· Fluids. You may need specialized IV fluids to balance your body's needs. °· Having a catheter placed to drain urine and prevent blockages. °· Preventing problems from occurring. This may mean avoiding certain medicines or procedures that can cause further injury to the kidneys. °In some cases treatment may also require: °· A procedure to remove toxic wastes from the body (dialysis or continuous renal replacement therapy - CRRT). °· Surgery. This may be done to repair a torn kidney, or to remove the blockage from the urinary system. °Follow these instructions at home: °Medicines °· Take over-the-counter and prescription medicines only as told by your health care provider. °· Do not take any new medicines without your health care provider's approval. Many medicines can worsen your kidney damage. °· Do not take any vitamin and mineral supplements without your health care provider's approval. Many nutritional supplements can worsen your kidney damage. °Lifestyle °· If your health care provider prescribed changes to your diet, follow them. You may need to decrease the amount of protein you eat. °· Achieve and maintain a healthy   weight. If you need help with this, ask your health care provider. °· Start or continue an exercise plan. Try to exercise at least 30 minutes a day, 5 days a week. °· Do not use any tobacco products, such as cigarettes, chewing tobacco, and e-cigarettes. If you need help quitting, ask your  health care provider. °General instructions °· Keep track of your blood pressure. Report changes in your blood pressure as told by your health care provider. °· Stay up to date with immunizations. Ask your health care provider which immunizations you need. °· Keep all follow-up visits as told by your health care provider. This is important. °Where to find more information °· American Association of Kidney Patients: www.aakp.org °· National Kidney Foundation: www.kidney.org °· American Kidney Fund: www.akfinc.org °· Life Options Rehabilitation Program: °? www.lifeoptions.org °? www.kidneyschool.org °Contact a health care provider if: °· Your symptoms get worse. °· You develop new symptoms. °Get help right away if: °· You develop symptoms of worsening kidney disease, which include: °? Headaches. °? Abnormally dark or light skin. °? Easy bruising. °? Frequent hiccups. °? Chest pain. °? Shortness of breath. °? End of menstruation in women. °? Seizures. °? Confusion or altered mental status. °? Abdominal or back pain. °? Itchiness. °· You have a fever. °· Your body is producing less urine. °· You have pain or bleeding when you urinate. °Summary °· Acute kidney injury is a sudden worsening of kidney function. °· Acute kidney injury can be caused by problems with blood flow to the kidneys, direct damage to the kidneys, and sudden blockage of urine flow. °· Symptoms of this condition may not be obvious until it becomes severe. Symptoms may include edema, lethargy, confusion, nausea or vomiting, and problems passing urine. °· This condition can usually be diagnosed with blood tests, urine tests, and imaging tests. Sometimes a kidney biopsy is done to diagnose this condition. °· Treatment for this condition often involves treating the underlying cause. It is treated with fluids, medicines, dialysis, diet changes, or surgery. °This information is not intended to replace advice given to you by your health care provider. Make  sure you discuss any questions you have with your health care provider. °Document Released: 02/23/2011 Document Revised: 07/23/2017 Document Reviewed: 07/31/2016 °Elsevier Patient Education © 2020 Elsevier Inc. ° °

## 2019-05-22 NOTE — TOC Transition Note (Signed)
Transition of Care [facility]) - CM/SW Discharge Note   Patient Details  Name: Brent Carpenter. MRN: [id number] Date of Birth: 03-Dec-1923  Transition of Care [facility]) CM/SW Contact:  Beverly Sessions, RN Phone Number: 05/22/2019, 4:07 PM   Clinical Narrative:    Patient discharged to [facility] in [place] today  Family transporting  RNCM spoke with Director [name] at [facility].  She confirms they have received insurance auth.  Discharge information faxed to [facility]    Final next level of care: [place] Barriers to Discharge: No Barriers Identified   Patient Goals and CMS Choice        Discharge Placement              Patient chooses bed at: Other - please specify in the comment section below:([facility]) Patient to be transferred to facility by: family Name of family member notified: Son and daughter in law Patient and family notified of of transfer: 05/22/19  Discharge Plan and Services                                     Social Determinants of Health ([place]) Interventions     Readmission Risk Interventions No flowsheet data found.

## 2019-05-22 NOTE — Progress Notes (Signed)
[facility]  ROUNDING NOTE   Subjective:   UOP 1200 Patient pulled at his foley causing gross hematuria and blood around his penile meatus.   1/2NS at 69mL/hr   Objective:  Vital signs in last 24 hours:  Temp:  [96.8 F (36 C)-98 F (36.7 C)] 97.6 F (36.4 C) (09/28 0548) Pulse Rate:  [51-59] 51 (09/28 0548) Resp:  [18] 18 (09/28 0548) BP: (128-164)/(59-63) 128/59 (09/28 0548) SpO2:  [97 %-99 %] 99 % (09/28 0548)  Weight change:  Filed Weights   05/17/19 1359  Weight: 72.6 kg    Intake/Output: I/O last 3 completed shifts: In: 1961.6 [P.O.:240; I.V.:1721.6] Out: 3000 [Urine:3000]   Intake/Output this shift:  Total I/O In: 440 [P.O.:440] Out: 1200 [Urine:1200]  Physical Exam: General: NAD, laying in bed  Head: + hard of hearing   Eyes: Anicteric, PERRL  Neck: Supple, trachea midline  Lungs:  Clear to auscultation  Heart: Regular rate and rhythm  Abdomen:  Soft, nontender,   Extremities:   peripheral edema.  Neurologic: Nonfocal, moving all four extremities  Skin: No lesions  GU: Foley with red urine    Basic Metabolic Panel: Recent Labs  Lab 05/18/19 0420 05/19/19 0330 05/20/19 0611 05/21/19 0456 05/22/19 0508  NA 148* 147* 145 147* 148*  K 4.3 4.1 3.8 3.6 3.5  CL 114* 119* 118* 119* 118*  CO2 19* 20* 17* 20* 22  GLUCOSE 94 104* 91 90 95  BUN 93* 94* 79* 67* 57*  CREATININE 3.54* 3.50* 2.97* 2.59* 2.30*  CALCIUM 8.9 8.5* 8.2* 8.4* 8.5*  PHOS  --   --   --  3.4  --     Liver Function Tests: Recent Labs  Lab 05/17/19 1406 05/18/19 0420 05/20/19 0611  AST 83* 69* 28  ALT 28 30 30   ALKPHOS 103 96 75  BILITOT 2.9* 1.9* 1.3*  PROT 7.0 6.1* 5.5*  ALBUMIN 3.6 3.3* 2.9*   No results for input(s): LIPASE, AMYLASE in the last 168 hours. No results for input(s): AMMONIA in the last 168 hours.  CBC: Recent Labs  Lab 05/17/19 1406 05/18/19 0420 05/19/19 0330 05/20/19 0611 05/21/19 0456 05/21/19 1242 05/22/19 0508  WBC 15.7*  15.5* 10.3 6.6 5.9  --  7.0  NEUTROABS 13.4*  --   --   --   --   --   --   HGB 9.7* 8.9* 7.8* 7.6* 7.7* 8.2* 8.5*  HCT 30.5* 27.9* 25.4* 24.2* 25.1*  --  27.1*  MCV 90.8 90.9 92.7 92.0 91.9  --  91.6  PLT 98* 102* 91* 68* 57*  --  60*    Cardiac Enzymes: Recent Labs  Lab 05/17/19 1406 05/18/19 0420 05/19/19 0330  CKTOTAL 2,305* 1,012* 388    BNP: Invalid input(s): POCBNP  CBG: No results for input(s): GLUCAP in the last 168 hours.  Microbiology: Results for orders placed or performed during the hospital encounter of 05/17/19  SARS Coronavirus 2 [facility] order, Performed in [facility] hospital lab) Nasopharyngeal Nasopharyngeal Swab     Status: None   Collection Time: 05/17/19  3:17 PM   Specimen: Nasopharyngeal Swab  Result Value Ref Range Status   SARS Coronavirus 2 NEGATIVE NEGATIVE Final    Comment: (NOTE) If result is NEGATIVE SARS-CoV-2 target nucleic acids are NOT DETECTED. The SARS-CoV-2 RNA is generally detectable in upper and lower  respiratory specimens during the acute phase of infection. The lowest  concentration of SARS-CoV-2 viral copies this assay can detect is 250  copies / mL. A negative result does not preclude SARS-CoV-2 infection  and should not be used as the sole basis for treatment or other  patient management decisions.  A negative result may occur with  improper specimen collection / handling, submission of specimen other  than nasopharyngeal swab, presence of viral mutation(s) within the  areas targeted by this assay, and inadequate number of viral copies  (<250 copies / mL). A negative result must be combined with clinical  observations, patient history, and epidemiological information. If result is POSITIVE SARS-CoV-2 target nucleic acids are DETECTED. The SARS-CoV-2 RNA is generally detectable in upper and lower  respiratory specimens dur ing the acute phase of infection.  Positive  results are indicative of active infection with  SARS-CoV-2.  Clinical  correlation with patient history and other diagnostic information is  necessary to determine patient infection status.  Positive results do  not rule out bacterial infection or co-infection with other viruses. If result is PRESUMPTIVE POSTIVE SARS-CoV-2 nucleic acids MAY BE PRESENT.   A presumptive positive result was obtained on the submitted specimen  and confirmed on repeat testing.  While 2019 novel coronavirus  (SARS-CoV-2) nucleic acids may be present in the submitted sample  additional confirmatory testing may be necessary for epidemiological  and / or clinical management purposes  to differentiate between  SARS-CoV-2 and other Sarbecovirus currently known to infect humans.  If clinically indicated additional testing with an alternate test  methodology [id number]) is advised. The SARS-CoV-2 RNA is generally  detectable in upper and lower respiratory sp ecimens during the acute  phase of infection. The expected result is Negative. Fact Sheet for Patients:  BoilerBrush.com.cyhttps://www.fda.gov/media/136312/download Fact Sheet for Healthcare Providers: https://pope.com/https://www.fda.gov/media/136313/download This test is not yet approved or cleared by the [place] FDA and has been authorized for detection and/or diagnosis of SARS-CoV-2 by FDA under an Emergency Use Authorization (EUA).  This EUA will remain in effect (meaning this test can be used) for the duration of the COVID-19 declaration under Section 564(b)(1) of the Act, 21 U.S.C. section 360bbb-3(b)(1), unless the authorization is terminated or revoked sooner. Performed at [facility], [address] [place], [place] [address]   SARS CORONAVIRUS 2 (TAT 6-24 HRS) Nasopharyngeal Nasopharyngeal Swab     Status: None   Collection Time: 05/20/19  3:38 PM   Specimen: Nasopharyngeal Swab  Result Value Ref Range Status   SARS Coronavirus 2 NEGATIVE NEGATIVE Final    Comment: (NOTE) SARS-CoV-2 target nucleic acids are  NOT DETECTED. The SARS-CoV-2 RNA is generally detectable in upper and lower respiratory specimens during the acute phase of infection. Negative results do not preclude SARS-CoV-2 infection, do not rule out co-infections with other pathogens, and should not be used as the sole basis for treatment or other patient management decisions. Negative results must be combined with clinical observations, patient history, and epidemiological information. The expected result is Negative. Fact Sheet for Patients: HairSlick.nohttps://www.fda.gov/media/138098/download Fact Sheet for Healthcare Providers: quierodirigir.comhttps://www.fda.gov/media/138095/download This test is not yet approved or cleared by the [place] FDA and  has been authorized for detection and/or diagnosis of SARS-CoV-2 by FDA under an Emergency Use Authorization (EUA). This EUA will remain  in effect (meaning this test can be used) for the duration of the COVID-19 declaration under Section 56 4(b)(1) of the Act, 21 U.S.C. section 360bbb-3(b)(1), unless the authorization is terminated or revoked sooner. Performed at [facility], [address] [place], [place] [address]     Coagulation Studies: No results  for input(s): LABPROT, INR in the last 72 hours.  Urinalysis: No results for input(s): COLORURINE, LABSPEC, PHURINE, GLUCOSEU, HGBUR, BILIRUBINUR, KETONESUR, PROTEINUR, UROBILINOGEN, NITRITE, LEUKOCYTESUR in the last 72 hours.  Invalid input(s): APPERANCEUR    Imaging: No results found.   Medications:   . sodium chloride 35 mL/hr at 05/22/19 0856   . amLODipine  5 mg Oral Daily  . brimonidine  1 drop Both Eyes BID  . Chlorhexidine Gluconate Cloth  6 each Topical Daily  . propranolol  60 mg Oral Daily  . sodium bicarbonate  650 mg Oral BID  . tamsulosin  0.4 mg Oral Daily   acetaminophen **OR** acetaminophen, ondansetron **OR** ondansetron (ZOFRAN) IV, polyethylene glycol, QUEtiapine  Assessment/ Plan:  Mr. Brent Carpenter. is a 85 y.o. [place] male with hypertension, tremor, who is admitted to [facility] on 05/17/2019 for Pleural effusion [J90] Elevated troponin [R79.89] Elevated CK [R74.8] AKI (acute kidney injury) (HCC) [N17.9] Fall, initial encounter [W19.XXXA]  1. Acute renal failure with metabolic acidosis on chronic kidney disease stage IV: baseline creatinine of 2.24, GFR of 25 on 10/17/2018.  Acute renal failure secondary to obstructive uropathy.  Foley placed with good urine output.  - IV fluids - Maintain foley catheter - PO bicar  2. Hypertension - tamsulosin and amlodipine.   3. Gross hematuria: secondary to trauma - Appreciate urology input.   LOS: 5 Mechelle Pates 9/28/202011:43 AM

## 2019-05-22 NOTE — Discharge Summary (Addendum)
[facility] [place] [facility]   PATIENT NAME: Brent Carpenter    MR#:  [id number]  DATE OF BIRTH:  [birth year]  DATE OF ADMISSION:  05/17/2019 ADMITTING PHYSICIAN: Campbell Stall, MD  DATE OF DISCHARGE: 05/22/2019  PRIMARY CARE PHYSICIAN: Jerl Mina, MD    ADMISSION DIAGNOSIS:  Pleural effusion [J90] Elevated troponin [R79.89] Elevated CK [R74.8] AKI (acute kidney injury) (HCC) [N17.9] Fall, initial encounter [W19.XXXA]  DISCHARGE DIAGNOSIS:  Active Problems:   Acute renal failure (ARF) (HCC)   SECONDARY DIAGNOSIS:   Past Medical History:  Diagnosis Date  . Aneurysm (HCC)    In 1970's, [facility]    HOSPITAL COURSE:   1.  Acute kidney injury on chronic kidney disease stage III.  Creatinine was elevated at 3.54 at its maximum and came down to 2.3 with IV fluid hydration.  Patient's baseline creatinine probably between 1.6 and 1.9. 2.  BPH with obstruction.  We started Flomax and Foley catheter was placed.  Would recommend voiding trial in about 1 week which can be done at the facility or through a urologist. 3.  Hematuria from pulling on the Foley catheter.  Would like the Foley catheter to come out as quick as possible would likely do a voiding trial in 1 week.  We did give IV fluids during the hospital course.  Patient was seen by urology.  Urology and this is likely trauma from pulling on the catheter. 4.  Thrombocytopenia.  His platelet count was always a little on the lower side.  His platelet count trended down but now stabilized.  Heparin subcutaneous injections stopped and aspirin stopped. 5.  Hypernatremia secondary to dehydration patient was hydrated up during the entire hospital course 6.  Hypertension patient placed on amlodipine 7.  Chronic tremor on propranolol I will lower the dose down to 40 mg daily 8.  Anemia.  Holding aspirin.  Hemoglobin did come up with decreasing the rate of IV fluids.  Last hemoglobin 8.5. 9.  Elevated liver function  test secondary to rhabdomyolysis 10.  Weakness.  Physical therapy recommended rehab  Discussion with the family today and they would rather take the patient to the rehab facility today.  Patient is on dysphagia 2 diet with nectar thick liquids.  Please have speech pathology follow-up with the patient and see if they can advance diet.  Recommend checking cbc and bmp in two to three days and check weekly after.   DISCARGE CONDITIONS:   Fair  CONSULTS OBTAINED:  Treatment Team:  Mady Haagensen, MD Sondra Come, MD  DRUG ALLERGIES:   Allergies  Allergen Reactions  . Other Other (See Comments)    Newspaper - makes him sneeze    DISCHARGE MEDICATIONS:   Allergies as of 05/22/2019      Reactions   Other Other (See Comments)   Newspaper - makes him sneeze      Medication List    STOP taking these medications   aspirin EC 81 MG tablet     TAKE these medications   acetaminophen 325 MG tablet Commonly known as: TYLENOL Take 2 tablets (650 mg total) by mouth every 6 (six) hours as needed for mild pain (or Fever >/= 101).   Alphagan P 0.1 % Soln Generic drug: brimonidine Place 1 drop into both eyes 2 (two) times daily.   amLODipine 5 MG tablet Commonly known as: NORVASC Take 1 tablet (5 mg total) by mouth daily. Start taking on: May 23, 2019   polyethylene glycol  17 g packet Commonly known as: MIRALAX / GLYCOLAX Take 17 g by mouth daily as needed for mild constipation.   propranolol 40 MG tablet Commonly known as: INDERAL Take 1 tablet (40 mg total) by mouth daily. What changed:   medication strength  how much to take   sodium bicarbonate 650 MG tablet Take 1 tablet (650 mg total) by mouth 2 (two) times daily.   tamsulosin 0.4 MG Caps capsule Commonly known as: FLOMAX Take 1 capsule (0.4 mg total) by mouth daily. Start taking on: May 23, 2019        DISCHARGE INSTRUCTIONS:   Follow-up Dr. at facility 1 day Follow-up urologist 1  week  If you experience worsening of your admission symptoms, develop shortness of breath, life threatening emergency, suicidal or homicidal thoughts you must seek medical attention immediately by calling 911 or calling your MD immediately  if symptoms less severe.  You Must read complete instructions/literature along with all the possible adverse reactions/side effects for all the Medicines you take and that have been prescribed to you. Take any new Medicines after you have completely understood and accept all the possible adverse reactions/side effects.   Please note  You were cared for by a hospitalist during your hospital stay. If you have any questions about your discharge medications or the care you received while you were in the hospital after you are discharged, you can call the unit and asked to speak with the hospitalist on call if the hospitalist that took care of you is not available. Once you are discharged, your primary care physician will handle any further medical issues. Please note that NO REFILLS for any discharge medications will be authorized once you are discharged, as it is imperative that you return to your primary care physician (or establish a relationship with a primary care physician if you do not have one) for your aftercare needs so that they can reassess your need for medications and monitor your lab values.    Today   CHIEF COMPLAINT:   Chief Complaint  Patient presents with  . Fall    HISTORY OF PRESENT ILLNESS:  Brent Carpenter  is a 85 y.o. male presented after a fall   VITAL SIGNS:  Blood pressure (!) 128/59, pulse (!) 51, temperature 97.6 F (36.4 C), temperature source Oral, resp. rate 18, height 5\' 11"  (1.803 m), weight 72.6 kg, SpO2 99 %.    PHYSICAL EXAMINATION:  GENERAL:  85 y.o.-year-old patient lying in the bed with no acute distress.  EYES: Pupils equal, round, reactive to light and accommodation. No scleral icterus. Extraocular muscles  intact.  HEENT: Head atraumatic, normocephalic. Oropharynx and nasopharynx clear.  NECK:  Supple, no jugular venous distention. No thyroid enlargement, no tenderness.  LUNGS: Normal breath sounds bilaterally, no wheezing, rales,rhonchi or crepitation. No use of accessory muscles of respiration.  CARDIOVASCULAR: S1, S2 normal. No murmurs, rubs, or gallops.  ABDOMEN: Soft, non-tender, non-distended. Bowel sounds present. No organomegaly or mass.  EXTREMITIES: No pedal edema, cyanosis, or clubbing.  NEUROLOGIC: Cranial nerves II through XII are intact.  Weakness lower extremities. Gait not checked.  PSYCHIATRIC: The patient is alert and answers a few yes or no questions.  SKIN: Bruising upper extremities.  Lower extremities some scabs and bruises.  DATA REVIEW:   CBC Recent Labs  Lab 05/22/19 0508  WBC 7.0  HGB 8.5*  HCT 27.1*  PLT 60*    Chemistries  Recent Labs  Lab 05/20/19 [phone]  05/22/19 2426  NA 145   < > 148*  K 3.8   < > 3.5  CL 118*   < > 118*  CO2 17*   < > 22  GLUCOSE 91   < > 95  BUN 79*   < > 57*  CREATININE 2.97*   < > 2.30*  CALCIUM 8.2*   < > 8.5*  AST 28  --   --   ALT 30  --   --   ALKPHOS 75  --   --   BILITOT 1.3*  --   --    < > = values in this interval not displayed.     Microbiology Results  Results for orders placed or performed during the hospital encounter of 05/17/19  SARS Coronavirus 2 [facility] order, Performed in [facility] hospital lab) Nasopharyngeal Nasopharyngeal Swab     Status: None   Collection Time: 05/17/19  3:17 PM   Specimen: Nasopharyngeal Swab  Result Value Ref Range Status   SARS Coronavirus 2 NEGATIVE NEGATIVE Final    Comment: (NOTE) If result is NEGATIVE SARS-CoV-2 target nucleic acids are NOT DETECTED. The SARS-CoV-2 RNA is generally detectable in upper and lower  respiratory specimens during the acute phase of infection. The lowest  concentration of SARS-CoV-2 viral copies this assay can detect is 250  copies /  mL. A negative result does not preclude SARS-CoV-2 infection  and should not be used as the sole basis for treatment or other  patient management decisions.  A negative result may occur with  improper specimen collection / handling, submission of specimen other  than nasopharyngeal swab, presence of viral mutation(s) within the  areas targeted by this assay, and inadequate number of viral copies  (<250 copies / mL). A negative result must be combined with clinical  observations, patient history, and epidemiological information. If result is POSITIVE SARS-CoV-2 target nucleic acids are DETECTED. The SARS-CoV-2 RNA is generally detectable in upper and lower  respiratory specimens dur ing the acute phase of infection.  Positive  results are indicative of active infection with SARS-CoV-2.  Clinical  correlation with patient history and other diagnostic information is  necessary to determine patient infection status.  Positive results do  not rule out bacterial infection or co-infection with other viruses. If result is PRESUMPTIVE POSTIVE SARS-CoV-2 nucleic acids MAY BE PRESENT.   A presumptive positive result was obtained on the submitted specimen  and confirmed on repeat testing.  While 2019 novel coronavirus  (SARS-CoV-2) nucleic acids may be present in the submitted sample  additional confirmatory testing may be necessary for epidemiological  and / or clinical management purposes  to differentiate between  SARS-CoV-2 and other Sarbecovirus currently known to infect humans.  If clinically indicated additional testing with an alternate test  methodology [phone]) is advised. The SARS-CoV-2 RNA is generally  detectable in upper and lower respiratory sp ecimens during the acute  phase of infection. The expected result is Negative. Fact Sheet for Patients:  BoilerBrush.com.cy Fact Sheet for Healthcare Providers: https://pope.com/ This test is  not yet approved or cleared by the [place] FDA and has been authorized for detection and/or diagnosis of SARS-CoV-2 by FDA under an Emergency Use Authorization (EUA).  This EUA will remain in effect (meaning this test can be used) for the duration of the COVID-19 declaration under Section 564(b)(1) of the Act, 21 U.S.C. section 360bbb-3(b)(1), unless the authorization is terminated or revoked sooner. Performed at [facility], [address], [place], [place]  [phone]   SARS CORONAVIRUS 2 (TAT 6-24 HRS) Nasopharyngeal Nasopharyngeal Swab     Status: None   Collection Time: 05/20/19  3:38 PM   Specimen: Nasopharyngeal Swab  Result Value Ref Range Status   SARS Coronavirus 2 NEGATIVE NEGATIVE Final    Comment: (NOTE) SARS-CoV-2 target nucleic acids are NOT DETECTED. The SARS-CoV-2 RNA is generally detectable in upper and lower respiratory specimens during the acute phase of infection. Negative results do not preclude SARS-CoV-2 infection, do not rule out co-infections with other pathogens, and should not be used as the sole basis for treatment or other patient management decisions. Negative results must be combined with clinical observations, patient history, and epidemiological information. The expected result is Negative. Fact Sheet for Patients: HairSlick.nohttps://www.fda.gov/media/138098/download Fact Sheet for Healthcare Providers: quierodirigir.comhttps://www.fda.gov/media/138095/download This test is not yet approved or cleared by the [place] FDA and  has been authorized for detection and/or diagnosis of SARS-CoV-2 by FDA under an Emergency Use Authorization (EUA). This EUA will remain  in effect (meaning this test can be used) for the duration of the COVID-19 declaration under Section 56 4(b)(1) of the Act, 21 U.S.C. section 360bbb-3(b)(1), unless the authorization is terminated or revoked sooner. Performed at [facility], [address] [place], [place] [id number]      Management plans discussed with the patient, family and they are in agreement.  CODE STATUS:     Code Status Orders  (From admission, onward)         Start     Ordered   05/17/19 1852  Full code  Continuous     05/17/19 1851        Code Status History    This patient has a current code status but no historical code status.   Advance Care Planning Activity      TOTAL TIME TAKING CARE OF THIS PATIENT: 40 minutes.    Alford Highlandichard Tamu Golz M.D on 05/22/2019 at 9:58 AM  Between 7am to 6pm - Pager - [phone]  After 6pm go to [email or web address] - password [email or web address]  [facility] Office  [phone]  CC: Primary care physician; Jerl MinaHedrick, James, MD

## 2019-05-23 LAB — PARATHYROID HORMONE, INTACT (NO CA): PTH: 53 pg/mL (ref 15–65)

## 2019-06-25 DEATH — deceased

## 2021-07-22 IMAGING — DX DG ABDOMEN 1V
2 series · 2 of 2 positions shown · non-contrast
Comparison: 05/18/2019, CT of the abdomen pelvis.

CLINICAL DATA: Possible left-sided kidney stone. History of smoking
and aneurysm.

EXAM:
ABDOMEN - 1 VIEW

[abdomen supine (1 of 2)]
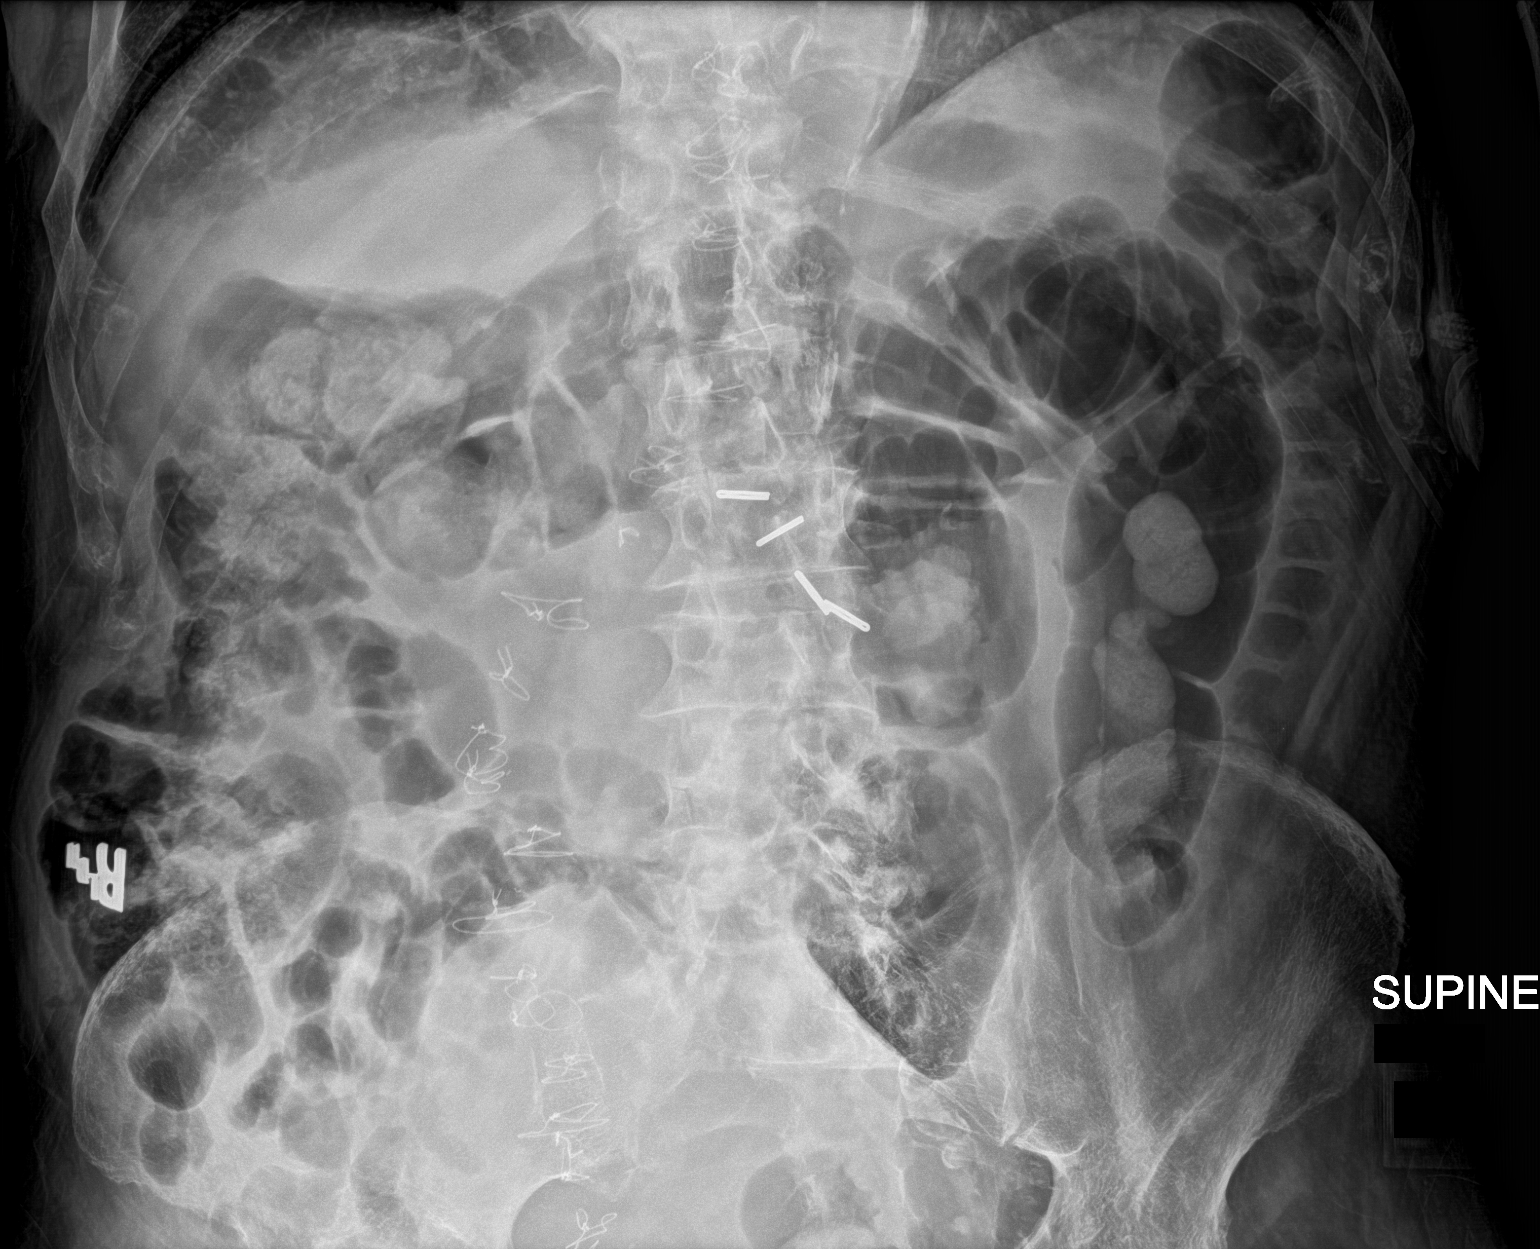

[abdomen supine (2 of 2)]
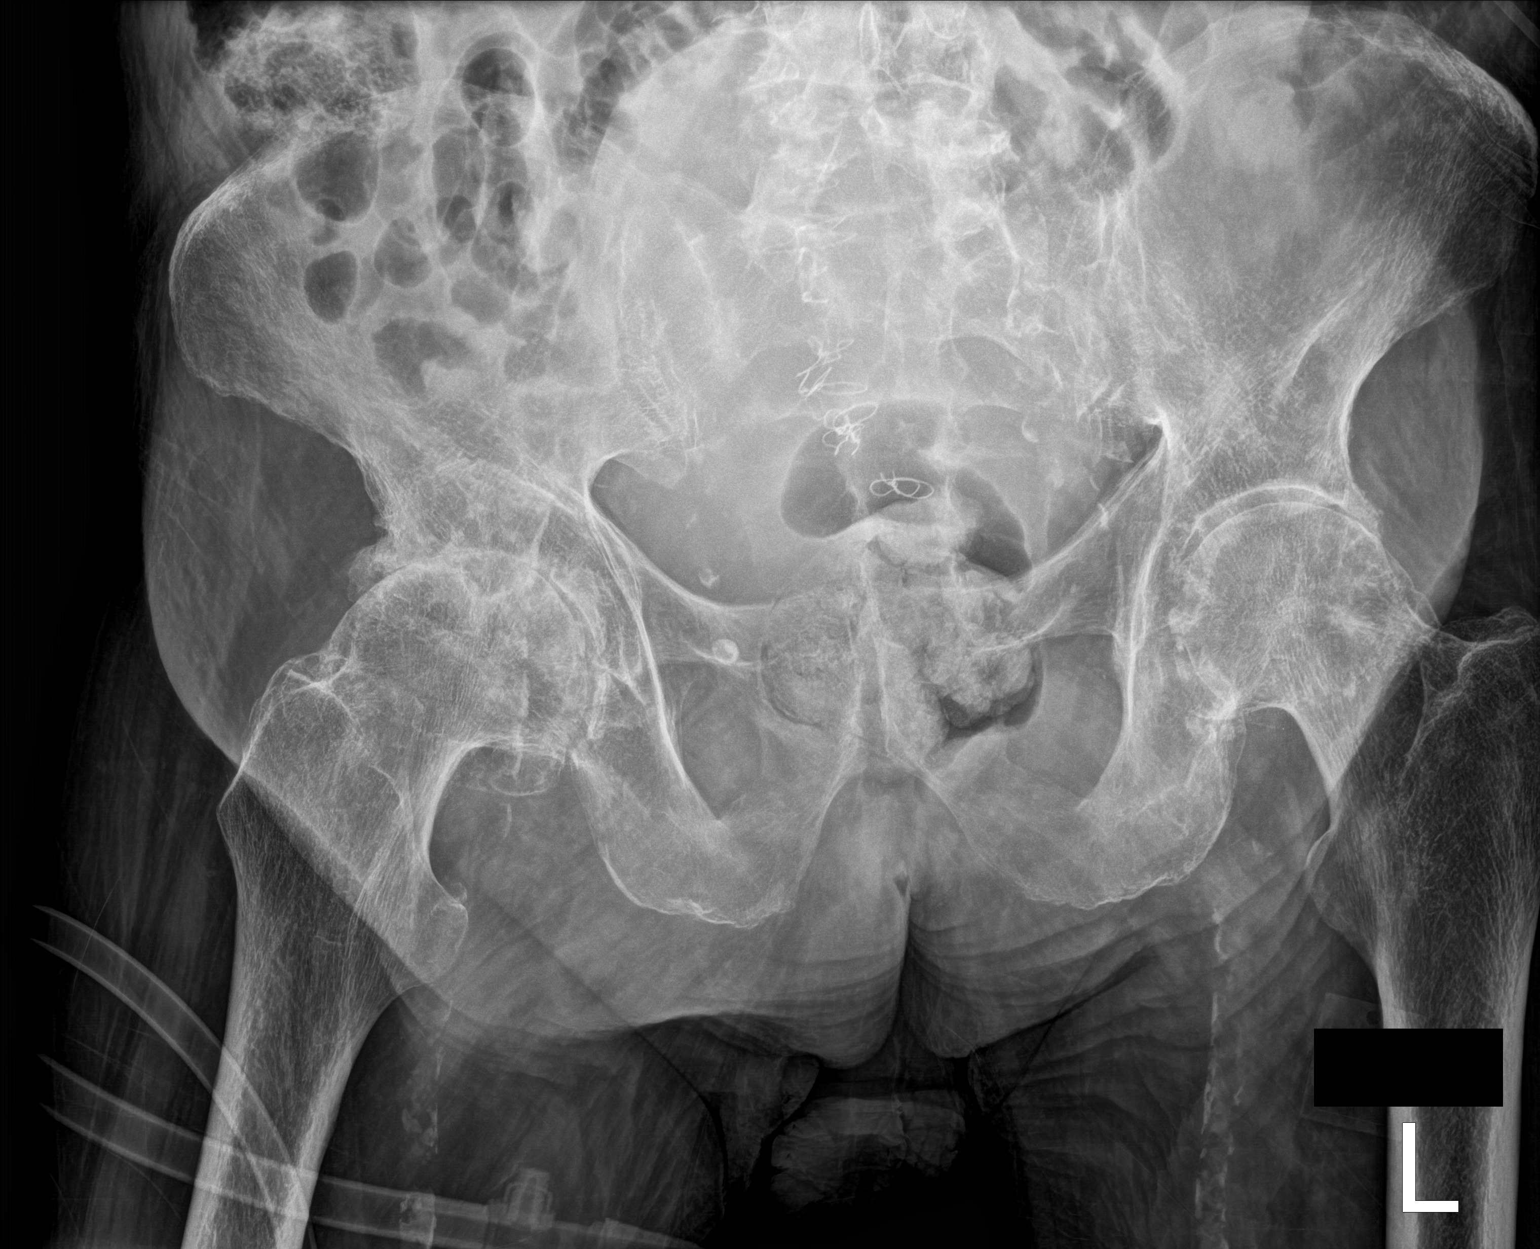

[2 of 2 positions shown; findings below may reference images not displayed]

FINDINGS: Renal shadows are mostly obscured by overlying bowel gas and stool.
No convincing renal stone. No evidence of a ureteral stone.

No bowel dilation to suggest obstruction.

Surgical vascular clips project in the mid abdomen. There are
sutures along the anterior abdominal wall midline.

Dense aortoiliac artery vascular calcifications. Soft tissues
otherwise unremarkable.

No acute skeletal abnormality. There are advanced arthropathic
changes of the right hip.
IMPRESSION: 1. No acute findings. No radiographic evidence of a renal or
ureteral stone.
